# Patient Record
Sex: Female | Born: 1935 | Race: White | Hispanic: No | Marital: Married
Health system: Southern US, Community
[De-identification: ages and names within clinical notes are randomized; demographics above are authoritative.]

## PROBLEM LIST (undated history)

## (undated) DIAGNOSIS — L570 Actinic keratosis: Secondary | ICD-10-CM

## (undated) HISTORY — DX: Actinic keratosis: L57.0

---

## 2004-11-08 ENCOUNTER — Ambulatory Visit: Payer: Self-pay | Admitting: Internal Medicine

## 2004-11-21 ENCOUNTER — Ambulatory Visit: Payer: Self-pay | Admitting: Unknown Physician Specialty

## 2005-11-13 ENCOUNTER — Emergency Department: Payer: Self-pay | Admitting: Emergency Medicine

## 2005-11-21 ENCOUNTER — Emergency Department: Payer: Self-pay | Admitting: Emergency Medicine

## 2005-11-22 ENCOUNTER — Ambulatory Visit: Payer: Self-pay | Admitting: Internal Medicine

## 2006-10-13 ENCOUNTER — Other Ambulatory Visit: Payer: Self-pay

## 2006-10-13 ENCOUNTER — Emergency Department: Payer: Self-pay | Admitting: Emergency Medicine

## 2006-11-27 ENCOUNTER — Ambulatory Visit: Payer: Self-pay | Admitting: Internal Medicine

## 2007-04-18 ENCOUNTER — Ambulatory Visit: Payer: Self-pay | Admitting: Family Medicine

## 2007-09-23 DIAGNOSIS — Z85828 Personal history of other malignant neoplasm of skin: Secondary | ICD-10-CM

## 2007-09-23 HISTORY — DX: Personal history of other malignant neoplasm of skin: Z85.828

## 2008-02-12 ENCOUNTER — Ambulatory Visit: Payer: Self-pay | Admitting: Internal Medicine

## 2008-07-14 ENCOUNTER — Ambulatory Visit: Payer: Self-pay | Admitting: Internal Medicine

## 2008-09-11 DIAGNOSIS — C4491 Basal cell carcinoma of skin, unspecified: Secondary | ICD-10-CM

## 2008-09-11 HISTORY — DX: Basal cell carcinoma of skin, unspecified: C44.91

## 2009-04-04 ENCOUNTER — Ambulatory Visit: Payer: Self-pay | Admitting: Family Medicine

## 2011-07-31 ENCOUNTER — Ambulatory Visit: Payer: Self-pay | Admitting: Family Medicine

## 2011-07-31 LAB — BASIC METABOLIC PANEL
Calcium, Total: 9.6 mg/dL (ref 8.5–10.1)
Creatinine: 0.77 mg/dL (ref 0.60–1.30)
EGFR (African American): >60
EGFR (Non-African Amer.): >60
Glucose: 87 mg/dL (ref 65–99)
Osmolality: 284 (ref 275–301)
Potassium: 3.8 mmol/L (ref 3.5–5.1)
Sodium: 142 mmol/L (ref 136–145)

## 2015-03-25 ENCOUNTER — Other Ambulatory Visit: Payer: Self-pay | Admitting: Family Medicine

## 2015-03-25 ENCOUNTER — Ambulatory Visit
Admission: RE | Admit: 2015-03-25 | Discharge: 2015-03-25 | Disposition: A | Discharge status: Home / Self Care | Payer: Medicare Other | Source: Ambulatory Visit | Attending: Family Medicine | Admitting: Family Medicine

## 2015-03-25 DIAGNOSIS — M79672 Pain in left foot: Secondary | ICD-10-CM | POA: Diagnosis present

## 2016-04-03 ENCOUNTER — Other Ambulatory Visit: Payer: Self-pay | Admitting: Family Medicine

## 2016-04-03 DIAGNOSIS — M8588 Other specified disorders of bone density and structure, other site: Secondary | ICD-10-CM

## 2016-04-03 DIAGNOSIS — M8580 Other specified disorders of bone density and structure, unspecified site: Secondary | ICD-10-CM

## 2016-08-30 ENCOUNTER — Other Ambulatory Visit: Payer: Self-pay | Admitting: Family Medicine

## 2016-08-30 DIAGNOSIS — M8000XA Age-related osteoporosis with current pathological fracture, unspecified site, initial encounter for fracture: Secondary | ICD-10-CM

## 2017-03-17 IMAGING — CR DG FOOT COMPLETE 3+V*L*
3 series · 3 of 3 positions shown · non-contrast
Comparison: None.

CLINICAL DATA: Lateral foot pain. The patient has fallen twice in
the last month.

EXAM:
LEFT FOOT - COMPLETE 3+ VIEW

[foot ap]
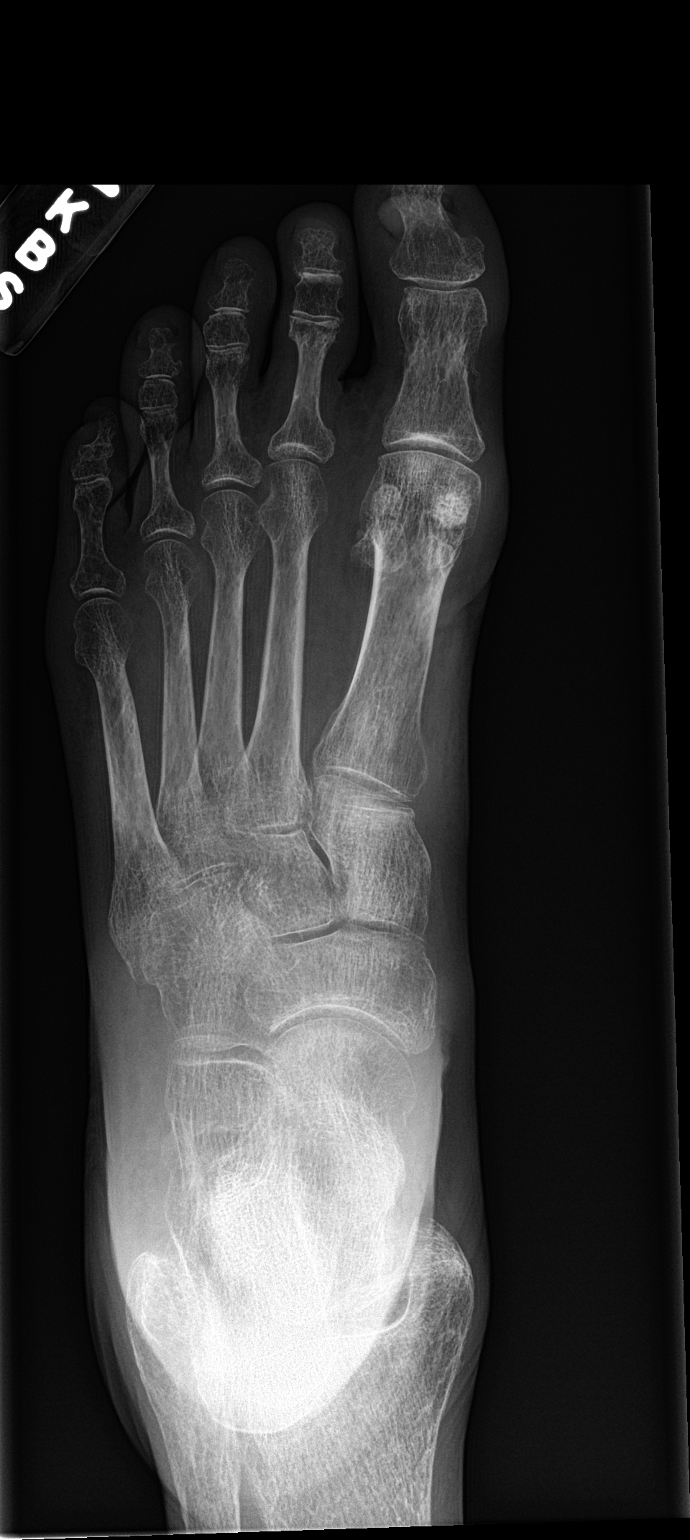

[foot obl]
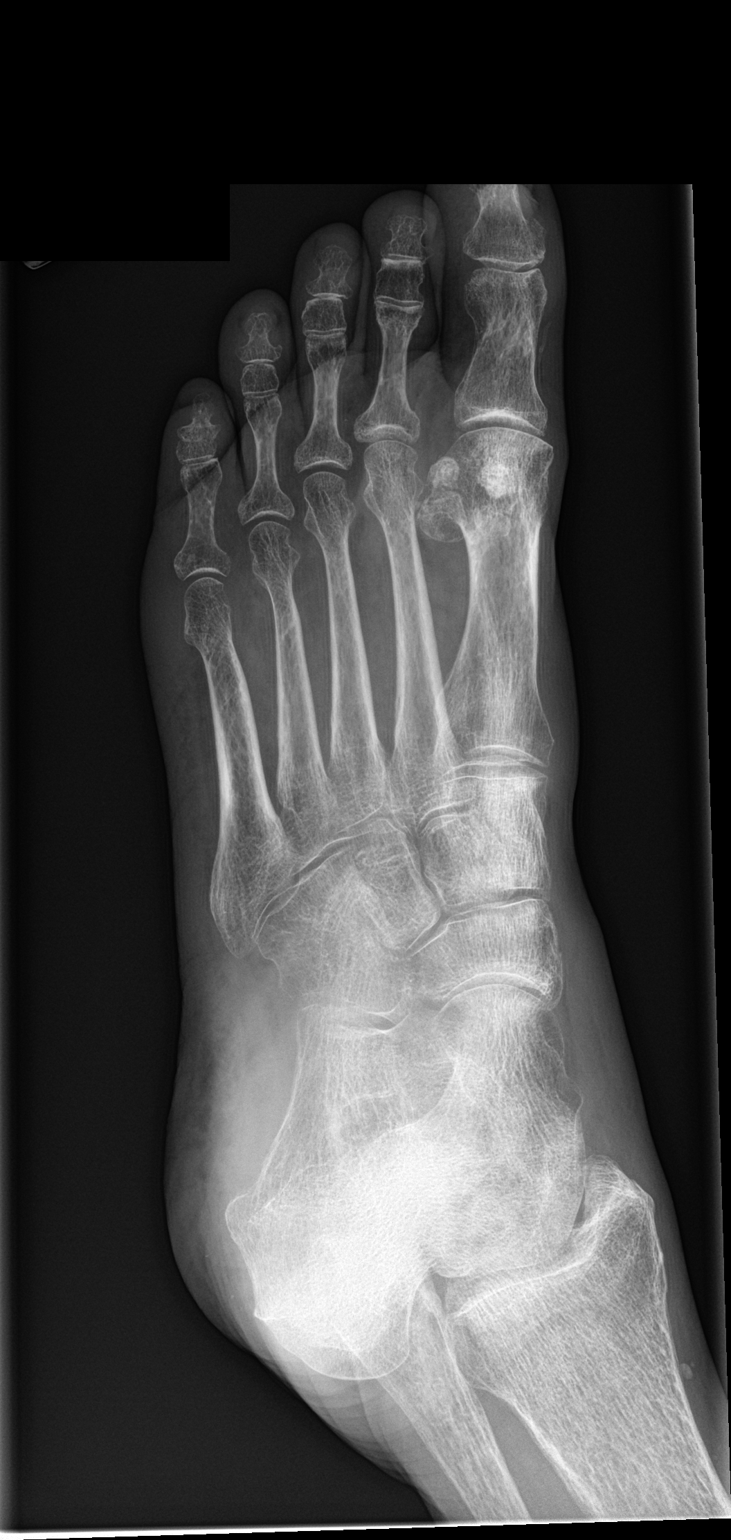

[foot lat]
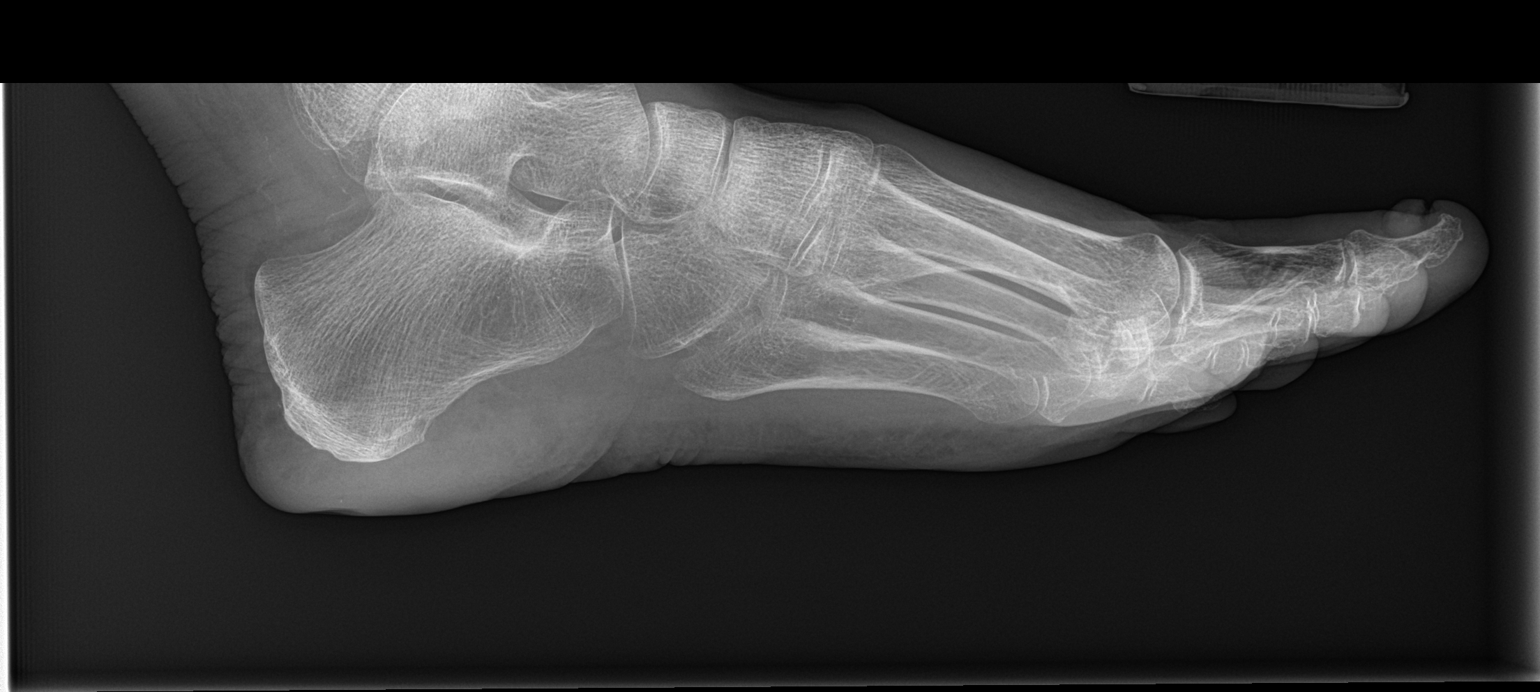

[3 of 3 positions shown; findings below may reference images not displayed]

FINDINGS: There is no fracture or dislocation. There is diffuse osteopenia.
Slight degenerative changes at the first metatarsal phalangeal
joint.
IMPRESSION: No acute abnormality.

## 2019-09-01 DIAGNOSIS — C4491 Basal cell carcinoma of skin, unspecified: Secondary | ICD-10-CM

## 2019-09-01 HISTORY — DX: Basal cell carcinoma of skin, unspecified: C44.91

## 2019-10-08 ENCOUNTER — Other Ambulatory Visit: Payer: Self-pay

## 2019-10-08 ENCOUNTER — Ambulatory Visit (INDEPENDENT_AMBULATORY_CARE_PROVIDER_SITE_OTHER): Payer: Medicare Other | Admitting: Dermatology

## 2019-10-08 DIAGNOSIS — L817 Pigmented purpuric dermatosis: Secondary | ICD-10-CM | POA: Diagnosis not present

## 2019-10-08 DIAGNOSIS — L578 Other skin changes due to chronic exposure to nonionizing radiation: Secondary | ICD-10-CM | POA: Diagnosis not present

## 2019-10-08 DIAGNOSIS — Z1283 Encounter for screening for malignant neoplasm of skin: Secondary | ICD-10-CM

## 2019-10-08 DIAGNOSIS — Z85828 Personal history of other malignant neoplasm of skin: Secondary | ICD-10-CM

## 2019-10-08 DIAGNOSIS — L821 Other seborrheic keratosis: Secondary | ICD-10-CM

## 2019-10-08 DIAGNOSIS — L820 Inflamed seborrheic keratosis: Secondary | ICD-10-CM | POA: Diagnosis not present

## 2019-10-08 DIAGNOSIS — D229 Melanocytic nevi, unspecified: Secondary | ICD-10-CM

## 2019-10-08 DIAGNOSIS — L814 Other melanin hyperpigmentation: Secondary | ICD-10-CM

## 2019-10-08 NOTE — Progress Notes (Signed)
   Follow-Up Visit   Subjective  Cassandra Glover is a 86 y.o. female who presents for the following: Annual Exam (1 year f/u TBSE, Hx of BCC L sup helix 09-01-2019, no symptoms, ) and lesions (crusty irritated growths on my chest and L thigh for several years now,).  A total body skin exam for skin cancer screening was performed today.  The following portions of the chart were reviewed this encounter and updated as appropriate: Allergies  Meds  Problems  Med Hx  Surg Hx  Fam Hx      Review of Systems: No other skin or systemic complaints.  Objective  Well appearing patient in no apparent distress; mood and affect are within normal limits.  A full examination was performed including scalp, head, eyes, ears, nose, lips, neck, chest, axillae, abdomen, back, buttocks, bilateral upper extremities, bilateral lower extremities, hands, feet, fingers, toes, fingernails, and toenails. All findings within normal limits unless otherwise noted below.  Objective  Left superior helix: Well healed scar with no evidence of recurrence.   Objective  Chest, L thigh (2): Erythematous keratotic or waxy stuck-on papule or plaque.   Objective  Left Foot - Anterior: Violaceous macules and patches.   Assessment & Plan  History of basal cell carcinoma (BCC) Left superior helix Clear today  Inflamed seborrheic keratosis (2) Chest, L thigh  Destruction of lesion - Chest, L thigh Complexity: simple   Destruction method: cryotherapy   Informed consent: discussed and consent obtained   Timeout:  patient name, date of birth, surgical site, and procedure verified Lesion destroyed using liquid nitrogen: Yes   Region frozen until ice ball extended beyond lesion: Yes   Outcome: patient tolerated procedure well with no complications   Post-procedure details: wound care instructions given    Schamberg's purpura Left Foot - Anterior  Schamberg's purpura with stasis dermatitis   Skin cancer  screening performed today. Actinic Damage - diffuse scaly erythematous macules with underlying dyspigmentation - Recommend daily broad spectrum sunscreen SPF 30+ to sun-exposed areas, reapply every 2 hours as needed.  - Call for new or changing lesions. Seborrheic Keratoses - Stuck-on, waxy, tan-brown papules and plaques  - Discussed benign etiology and prognosis. - Observe - Call for any changes Varicose Veins - Dilated blue, purple or red veins at the lower extremities - Reassured - These can be treated by sclerotherapy (a procedure to inject a medicine into the veins to make them disappear) if desired, but the treatment is not covered by insurance History of Squamous Cell Carcinoma of the Skin - No evidence of recurrence today - No lymphadenopathy - Recommend regular full body skin exams - Recommend daily broad spectrum sunscreen SPF 30+ to sun-exposed areas, reapply every 2 hours as needed.  - Call if any new or changing lesions are noted between office visits Melanocytic Nevi - Tan-brown and/or pink-flesh-colored symmetric macules and papules - Benign appearing on exam today - Observation - Call clinic for new or changing moles - Recommend daily use of broad spectrum spf 30+ sunscreen to sun-exposed areas.  Lentigines - Scattered tan macules - Discussed due to sun exposure - Benign, observe - Call for any changes  Return in about 1 year (around 10/07/2020) for TBSE.  IMarye Round, CMA, am acting as scribe for Sarina Ser, MD . Documentation: I have reviewed the above documentation for accuracy and completeness, and I agree with the above.  Sarina Ser, MD

## 2019-10-08 NOTE — Patient Instructions (Signed)
Prior to procedure, discussed risks of blister formation, small wound, skin dyspigmentation, or rare scar following cryotherapy.

## 2019-10-12 ENCOUNTER — Encounter: Payer: Self-pay | Admitting: Dermatology

## 2020-02-18 ENCOUNTER — Ambulatory Visit: Payer: Medicare Other | Admitting: Dermatology

## 2020-03-15 ENCOUNTER — Ambulatory Visit (INDEPENDENT_AMBULATORY_CARE_PROVIDER_SITE_OTHER): Payer: Medicare Other | Admitting: Dermatology

## 2020-03-15 ENCOUNTER — Other Ambulatory Visit: Payer: Self-pay

## 2020-03-15 DIAGNOSIS — L578 Other skin changes due to chronic exposure to nonionizing radiation: Secondary | ICD-10-CM

## 2020-03-15 DIAGNOSIS — L570 Actinic keratosis: Secondary | ICD-10-CM

## 2020-03-15 DIAGNOSIS — R600 Localized edema: Secondary | ICD-10-CM

## 2020-03-15 DIAGNOSIS — Z85828 Personal history of other malignant neoplasm of skin: Secondary | ICD-10-CM | POA: Diagnosis not present

## 2020-03-15 DIAGNOSIS — L719 Rosacea, unspecified: Secondary | ICD-10-CM

## 2020-03-15 NOTE — Progress Notes (Signed)
   Follow-Up Visit   Subjective  Cassandra Glover is a 86 y.o. female who presents for the following: Follow-up (History of BCC of left sup helix - excised 08/04/2019, re-excised + margin 09/01/2019 (margins free)) and Other (Recheck rosacea papule vs other of left nose - Aczone samples given 08/04/2019).  The following portions of the chart were reviewed this encounter and updated as appropriate:  Allergies  Meds  Problems  Med Hx  Surg Hx  Fam Hx     Review of Systems:  No other skin or systemic complaints except as noted in HPI or Assessment and Plan.  Objective  Well appearing patient in no apparent distress; mood and affect are within normal limits.  A focused examination was performed including face, ears. Relevant physical exam findings are noted in the Assessment and Plan.  Objective  Left Superior Helix: Well healed scar with no evidence of recurrence.   Objective  Left Nasal Sidewall: Erythematous thin papules/macules with gritty scale.   Objective  Left nose: Pink papule   Objective  Right Lower Eyelid : Mild edema   Assessment & Plan    Actinic Damage - diffuse scaly erythematous macules with underlying dyspigmentation - Recommend daily broad spectrum sunscreen SPF 30+ to sun-exposed areas, reapply every 2 hours as needed.  - Call for new or changing lesions.   History of basal cell carcinoma (BCC) Left Superior Helix Clear. Observe for recurrence. Call clinic for new or changing lesions.  Recommend regular skin exams, daily broad-spectrum spf 30+ sunscreen use, and photoprotection.     AK (actinic keratosis) Left Nasal Sidewall  Destruction of lesion - Left Nasal Sidewall Complexity: simple   Destruction method: cryotherapy   Informed consent: discussed and consent obtained   Timeout:  patient name, date of birth, surgical site, and procedure verified Lesion destroyed using liquid nitrogen: Yes   Region frozen until ice ball extended beyond  lesion: Yes   Outcome: patient tolerated procedure well with no complications   Post-procedure details: wound care instructions given    Rosacea Left nose Alternate Aczone gel and Altreno qhs - samples given.  Localized edema Right Lower Eyelid  Mild. No treatment  Return in about 4 months (around 07/15/2020).  I, Ashok Cordia, CMA, am acting as scribe for Sarina Ser, MD .  Documentation: I have reviewed the above documentation for accuracy and completeness, and I agree with the above.  Sarina Ser, MD

## 2020-03-15 NOTE — Patient Instructions (Signed)

## 2020-03-16 ENCOUNTER — Encounter: Payer: Self-pay | Admitting: Dermatology

## 2020-06-30 ENCOUNTER — Other Ambulatory Visit: Payer: Self-pay

## 2020-06-30 ENCOUNTER — Ambulatory Visit (INDEPENDENT_AMBULATORY_CARE_PROVIDER_SITE_OTHER): Payer: Medicare Other | Admitting: Dermatology

## 2020-06-30 DIAGNOSIS — L578 Other skin changes due to chronic exposure to nonionizing radiation: Secondary | ICD-10-CM | POA: Diagnosis not present

## 2020-06-30 DIAGNOSIS — Z872 Personal history of diseases of the skin and subcutaneous tissue: Secondary | ICD-10-CM | POA: Diagnosis not present

## 2020-06-30 DIAGNOSIS — L719 Rosacea, unspecified: Secondary | ICD-10-CM | POA: Diagnosis not present

## 2020-06-30 DIAGNOSIS — Z85828 Personal history of other malignant neoplasm of skin: Secondary | ICD-10-CM | POA: Diagnosis not present

## 2020-06-30 NOTE — Progress Notes (Signed)
   Follow-Up Visit   Subjective  Cassandra Glover is a 87 y.o. female who presents for the following: Actinic Keratosis (L nasal sidewal 63m f/u).  The following portions of the chart were reviewed this encounter and updated as appropriate:   Allergies  Meds  Problems  Med Hx  Surg Hx  Fam Hx     Review of Systems:  No other skin or systemic complaints except as noted in HPI or Assessment and Plan.  Objective  Well appearing patient in no apparent distress; mood and affect are within normal limits.  A focused examination was performed including face. Relevant physical exam findings are noted in the Assessment and Plan.  Objective  Left nasal sidewall: Nose clear  Objective  nose: Nose clear today   Assessment & Plan   Actinic Damage - chronic, secondary to cumulative UV radiation exposure/sun exposure over time - diffuse scaly erythematous macules with underlying dyspigmentation - Recommend daily broad spectrum sunscreen SPF 30+ to sun-exposed areas, reapply every 2 hours as needed.  - Call for new or changing lesions.  History of actinic keratoses Left nasal sidewall  Clear, observe  Rosacea nose  Rosacea is a chronic progressive skin condition usually affecting the face of adults, causing redness and/or acne bumps. It is treatable but not curable. It sometimes affects the eyes (ocular rosacea) as well. It may respond to topical and/or systemic medication and can flare with stress, sun exposure, alcohol, exercise and some foods.  Daily application of broad spectrum spf 30+ sunscreen to face is recommended to reduce flares.  Cont Altreno qohs, pt has samples   History of Basal Cell Carcinoma of the Skin - No evidence of recurrence today - Recommend regular full body skin exams - Recommend daily broad spectrum sunscreen SPF 30+ to sun-exposed areas, reapply every 2 hours as needed.  - Call if any new or changing lesions are noted between office visits - L sup  helix, clear  Return for as scheduled 10/13/20 for TBSE, hx of BCC, AKs.   I, Sonya Hupman, RMA, am acting as scribe for Armida Sans, MD .  Documentation: I have reviewed the above documentation for accuracy and completeness, and I agree with the above.  Armida Sans, MD

## 2020-07-01 ENCOUNTER — Encounter: Payer: Self-pay | Admitting: Dermatology

## 2020-10-13 ENCOUNTER — Ambulatory Visit (INDEPENDENT_AMBULATORY_CARE_PROVIDER_SITE_OTHER): Payer: Medicare Other | Admitting: Dermatology

## 2020-10-13 ENCOUNTER — Encounter: Payer: Self-pay | Admitting: Dermatology

## 2020-10-13 ENCOUNTER — Other Ambulatory Visit: Payer: Self-pay

## 2020-10-13 DIAGNOSIS — D1800 Hemangioma unspecified site: Secondary | ICD-10-CM

## 2020-10-13 DIAGNOSIS — Z1283 Encounter for screening for malignant neoplasm of skin: Secondary | ICD-10-CM

## 2020-10-13 DIAGNOSIS — L821 Other seborrheic keratosis: Secondary | ICD-10-CM

## 2020-10-13 DIAGNOSIS — D692 Other nonthrombocytopenic purpura: Secondary | ICD-10-CM

## 2020-10-13 DIAGNOSIS — I872 Venous insufficiency (chronic) (peripheral): Secondary | ICD-10-CM

## 2020-10-13 DIAGNOSIS — D229 Melanocytic nevi, unspecified: Secondary | ICD-10-CM

## 2020-10-13 DIAGNOSIS — Z85828 Personal history of other malignant neoplasm of skin: Secondary | ICD-10-CM | POA: Diagnosis not present

## 2020-10-13 DIAGNOSIS — L578 Other skin changes due to chronic exposure to nonionizing radiation: Secondary | ICD-10-CM

## 2020-10-13 DIAGNOSIS — Z872 Personal history of diseases of the skin and subcutaneous tissue: Secondary | ICD-10-CM

## 2020-10-13 DIAGNOSIS — L814 Other melanin hyperpigmentation: Secondary | ICD-10-CM

## 2020-10-13 NOTE — Progress Notes (Signed)
Follow-Up Visit   Subjective  Cassandra Glover is a 87 y.o. female who presents for the following: Total body skin exam (Hx of BCCs, AKs). The patient presents for Total-Body Skin Exam (TBSE) for skin cancer screening and mole check.  The following portions of the chart were reviewed this encounter and updated as appropriate:   Allergies  Meds  Problems  Med Hx  Surg Hx  Fam Hx     Review of Systems:  No other skin or systemic complaints except as noted in HPI or Assessment and Plan.  Objective  Well appearing patient in no apparent distress; mood and affect are within normal limits.  A full examination was performed including scalp, head, eyes, ears, nose, lips, neck, chest, axillae, abdomen, back, buttocks, bilateral upper extremities, bilateral lower extremities, hands, feet, fingers, toes, fingernails, and toenails. All findings within normal limits unless otherwise noted below.  Objective  multiple: Well healed scars with no evidence of recurrence.   Objective  bil lower legs: Stasis changes bil lower legs   Assessment & Plan    Lentigines - Scattered tan macules - Due to sun exposure - Benign-appering, observe - Recommend daily broad spectrum sunscreen SPF 30+ to sun-exposed areas, reapply every 2 hours as needed. - Call for any changes  Seborrheic Keratoses - Stuck-on, waxy, tan-brown papules and/or plaques  - Benign-appearing - Discussed benign etiology and prognosis. - Observe - Call for any changes  Melanocytic Nevi - Tan-brown and/or pink-flesh-colored symmetric macules and papules - Benign appearing on exam today - Observation - Call clinic for new or changing moles - Recommend daily use of broad spectrum spf 30+ sunscreen to sun-exposed areas.   Hemangiomas - Red papules - Discussed benign nature - Observe - Call for any changes  Actinic Damage - Chronic condition, secondary to cumulative UV/sun exposure - diffuse scaly erythematous  macules with underlying dyspigmentation - Recommend daily broad spectrum sunscreen SPF 30+ to sun-exposed areas, reapply every 2 hours as needed.  - Staying in the shade or wearing long sleeves, sun glasses (UVA+UVB protection) and wide brim hats (4-inch brim around the entire circumference of the hat) are also recommended for sun protection.  - Call for new or changing lesions.  Skin cancer screening performed today.  Purpura - Chronic; persistent and recurrent.  Treatable, but not curable. - Violaceous macules and patches - Benign - Related to trauma, age, sun damage and/or use of blood thinners, chronic use of topical and/or oral steroids - Observe - Can use OTC arnica containing moisturizer such as Dermend Bruise Formula if desired - Call for worsening or other concerns   History of PreCancerous Actinic Keratosis  - site(s) of PreCancerous Actinic Keratosis clear today. - these may recur and new lesions may form requiring treatment to prevent transformation into skin cancer - observe for new or changing spots and contact [facility] for appointment if occur - photoprotection with sun protective clothing; sunglasses and broad spectrum sunscreen with SPF of at least 30 + and frequent self skin exams recommended - yearly exams by a dermatologist recommended for persons with history of PreCancerous Actinic Keratoses  History of basal cell carcinoma (BCC) multiple  Clear. Observe for recurrence. Call clinic for new or changing lesions.  Recommend regular skin exams, daily broad-spectrum spf 30+ sunscreen use, and photoprotection.     Stasis dermatitis of both legs bil lower legs  Chronic, persistent Stasis in the legs causes chronic leg swelling, which may result in itchy or  painful rashes, skin discoloration, skin texture changes, and sometimes ulceration.  Recommend daily compression hose/stockings- easiest to put on first thing in morning, remove at bedtime.  Elevate legs as  much as possible. Avoid salt/sodium rich foods.   Benign, Observe   Skin cancer screening  Return in about 1 year (around 10/13/2021) for TBSE, Hx of BCC, Hx of AKs.  I, Othelia Pulling, RMA, am acting as scribe for Sarina Ser, MD .  Documentation: I have reviewed the above documentation for accuracy and completeness, and I agree with the above.  Sarina Ser, MD

## 2020-10-13 NOTE — Patient Instructions (Signed)

## 2020-10-17 ENCOUNTER — Encounter: Payer: Self-pay | Admitting: Dermatology

## 2021-09-13 ENCOUNTER — Encounter: Payer: Self-pay | Admitting: Dermatology

## 2021-09-14 ENCOUNTER — Ambulatory Visit: Payer: Medicare Other | Admitting: Dermatology

## 2021-10-19 ENCOUNTER — Encounter: Payer: Medicare Other | Admitting: Dermatology

## 2022-04-19 ENCOUNTER — Ambulatory Visit (INDEPENDENT_AMBULATORY_CARE_PROVIDER_SITE_OTHER): Payer: Medicare Other | Admitting: Dermatology

## 2022-04-19 DIAGNOSIS — L820 Inflamed seborrheic keratosis: Secondary | ICD-10-CM

## 2022-04-19 DIAGNOSIS — L578 Other skin changes due to chronic exposure to nonionizing radiation: Secondary | ICD-10-CM

## 2022-04-19 DIAGNOSIS — L821 Other seborrheic keratosis: Secondary | ICD-10-CM

## 2022-04-19 DIAGNOSIS — D485 Neoplasm of uncertain behavior of skin: Secondary | ICD-10-CM

## 2022-04-19 DIAGNOSIS — D492 Neoplasm of unspecified behavior of bone, soft tissue, and skin: Secondary | ICD-10-CM

## 2022-04-19 MED ORDER — MUPIROCIN 2 % EX OINT: TOPICAL_OINTMENT | CUTANEOUS | 1 refills | Status: DC

## 2022-04-19 NOTE — Progress Notes (Unsigned)
Follow-Up Visit   Subjective  Cassandra Glover is a 88 y.o. female who presents for the following: Skin Problem. The patient has spots, moles and lesions to be evaluated, some may be new or changing and the patient has concerns that these could be cancer.   The following portions of the chart were reviewed this encounter and updated as appropriate:   Allergies  Meds  Problems  Med Hx  Surg Hx  Fam Hx     Review of Systems:  No other skin or systemic complaints except as noted in HPI or Assessment and Plan.  Objective  Well appearing patient in no apparent distress; mood and affect are within normal limits.  A focused examination was performed including neck,chest,hands. Relevant physical exam findings are noted in the Assessment and Plan.  left neck x 3, left wrist x 1  (4) (4) Stuck-on, waxy, tan-brown papules -- Discussed benign etiology and prognosis.   right 5th finger MCP 1.5 cm hyperkeratotic papule         Assessment & Plan  Inflamed seborrheic keratosis (4) left neck x 3, left wrist x 1  (4)  Symptomatic, irritating, patient would like treated.   Destruction of lesion - left neck x 3, left wrist x 1  (4) Complexity: simple   Destruction method: cryotherapy   Informed consent: discussed and consent obtained   Timeout:  patient name, date of birth, surgical site, and procedure verified Lesion destroyed using liquid nitrogen: Yes   Region frozen until ice ball extended beyond lesion: Yes   Outcome: patient tolerated procedure well with no complications   Post-procedure details: wound care instructions given    Neoplasm of skin right 5th finger MCP  Skin excision  Lesion length (cm):  1.5 Lesion width (cm):  1.5 Margin per side (cm):  0.2 Total excision diameter (cm):  1.9 Informed consent: discussed and consent obtained   Timeout: patient name, date of birth, surgical site, and procedure verified   Procedure prep:  Patient was prepped and draped  in usual sterile fashion Prep type:  Isopropyl alcohol and povidone-iodine Anesthesia: the lesion was anesthetized in a standard fashion   Anesthetic:  1% lidocaine w/ epinephrine 1-100,000 buffered w/ 8.4% NaHCO3 Instrument used: #15 blade   Hemostasis achieved with: pressure   Hemostasis achieved with comment:  Electrocautery Outcome: patient tolerated procedure well with no complications   Post-procedure details: sterile dressing applied and wound care instructions given   Dressing type: bandage and pressure dressing (Mupirocin)    Destruction of lesion  Destruction method: electrodesiccation and curettage   Informed consent: discussed and consent obtained   Timeout:  patient name, date of birth, surgical site, and procedure verified Anesthesia: the lesion was anesthetized in a standard fashion   Anesthetic:  1% lidocaine w/ epinephrine 1-100,000 buffered w/ 8.4% NaHCO3 Curettage performed in three different directions: Yes   Electrodesiccation performed over the curetted area: Yes   Curettage cycles:  3 Final wound size (cm):  1.9 Hemostasis achieved with:  electrodesiccation Outcome: patient tolerated procedure well with no complications   Post-procedure details: sterile dressing applied and wound care instructions given   Dressing type: petrolatum    Specimen 1 - Surgical pathology Differential Diagnosis: R/O SCC  Check Margins: No  Related Medications mupirocin ointment (BACTROBAN) 2 % Apply to skin qd-bid  Actinic Damage - chronic, secondary to cumulative UV radiation exposure/sun exposure over time - diffuse scaly erythematous macules with underlying dyspigmentation - Recommend daily broad spectrum sunscreen  SPF 30+ to sun-exposed areas, reapply every 2 hours as needed.  - Recommend staying in the shade or wearing long sleeves, sun glasses (UVA+UVB protection) and wide brim hats (4-inch brim around the entire circumference of the hat). - Call for new or changing  lesions.  Seborrheic Keratoses - Stuck-on, waxy, tan-brown papules and/or plaques  - Benign-appearing - Discussed benign etiology and prognosis. - Observe - Call for any changes  Return in about 1 year (around 04/20/2023) for hx of skin cancer .  IMarye Round, CMA, am acting as scribe for Sarina Ser, MD .  Documentation: I have reviewed the above documentation for accuracy and completeness, and I agree with the above.  Sarina Ser, MD

## 2022-04-19 NOTE — Patient Instructions (Addendum)
Wound Care Instructions  Cleanse wound gently with soap and water once a day then pat dry with clean gauze. Apply a thin coat of Mupirocin  over the wound (unless you have an allergy to this).  Do not pick or remove scabs. Do not remove the yellow or white "healing tissue" from the base of the wound.  Cover the wound with fresh, clean, nonstick gauze and secure with paper tape. You may use Band-Aids in place of gauze and tape if the wound is small enough, but would recommend trimming much of the tape off as there is often too much. Sometimes Band-Aids can irritate the skin.  You should call the office for your biopsy report after 1 week if you have not already been contacted.  If you experience any problems, such as abnormal amounts of bleeding, swelling, significant bruising, significant pain, or evidence of infection, please call the office immediately.  FOR ADULT SURGERY PATIENTS: If you need something for pain relief you may take 1 extra strength Tylenol (acetaminophen) AND 2 Ibuprofen ('200mg'$  each) together every 4 hours as needed for pain. (do not take these if you are allergic to them or if you have a reason you should not take them.) Typically, you may only need pain medication for 1 to 3 days.         Cryotherapy Aftercare  Wash gently with soap and water everyday.   Apply Vaseline and Band-Aid daily until healed.      Due to recent changes in healthcare laws, you may see results of your pathology and/or laboratory studies on MyChart before the doctors have had a chance to review them. We understand that in some cases there may be results that are confusing or concerning to you. Please understand that not all results are received at the same time and often the doctors may need to interpret multiple results in order to provide you with the best plan of care or course of treatment. Therefore, we ask that you please give [place] 2 business days to thoroughly review all your results before  contacting the office for clarification. Should we see a critical lab result, you will be contacted sooner.   If You Need Anything After Your Visit  If you have any questions or concerns for your doctor, please call our main line at [phone] and press option 4 to reach your doctor's medical assistant. If no one answers, please leave a voicemail as directed and we will return your call as soon as possible. Messages left after 4 pm will be answered the following business day.   You may also send [place] a message via [facility]. We typically respond to MyChart messages within 1-2 business days.  For prescription refills, please ask your pharmacy to contact our office. Our fax number is [phone].  If you have an urgent issue when the clinic is closed that cannot wait until the next business day, you can page your doctor at the number below.    Please note that while we do our best to be available for urgent issues outside of office hours, we are not available 24/7.   If you have an urgent issue and are unable to reach [place], you may choose to seek medical care at your doctor's office, retail clinic, urgent care center, or emergency room.  If you have a medical emergency, please immediately call 911 or go to the emergency department.  Pager Numbers  - Dr. Nehemiah Massed: [phone]  - Dr. Laurence Ferrari: [phone]  -  Dr. Nicole Kindred: [phone]  In the event of inclement weather, please call our main line at [phone] for an update on the status of any delays or closures.  Dermatology Medication Tips: Please keep the boxes that topical medications come in in order to help keep track of the instructions about where and how to use these. Pharmacies typically print the medication instructions only on the boxes and not directly on the medication tubes.   If your medication is too expensive, please contact our office at [phone] option 4 or send [place] a message through [place].   We are unable to tell  what your co-pay for medications will be in advance as this is different depending on your insurance coverage. However, we may be able to find a substitute medication at lower cost or fill out paperwork to get insurance to cover a needed medication.   If a prior authorization is required to get your medication covered by your insurance company, please allow [place] 1-2 business days to complete this process.  Drug prices often vary depending on where the prescription is filled and some pharmacies may offer cheaper prices.  The website www.goodrx.com contains coupons for medications through different pharmacies. The prices here do not account for what the cost may be with help from insurance (it may be cheaper with your insurance), but the website can give you the price if you did not use any insurance.  - You can print the associated coupon and take it with your prescription to the pharmacy.  - You may also stop by our office during regular business hours and pick up a GoodRx coupon card.  - If you need your prescription sent electronically to a different pharmacy, notify our office through [facility] or by phone at [phone] option 4.     Si Usted Necesita Algo Despus de Su Visita  Tambin puede enviarnos un mensaje a travs de Pharmacist, community. Por lo general respondemos a los mensajes de MyChart en el transcurso de 1 a 2 das hbiles.  Para renovar recetas, por favor pida a su farmacia que se ponga en contacto con nuestra oficina. Harland Dingwall de fax es [place] [phone].  Si tiene un asunto urgente cuando la clnica est cerrada y que no puede esperar hasta el siguiente da hbil, puede llamar/localizar a su doctor(a) al nmero que aparece a continuacin.   Por favor, tenga en cuenta que aunque hacemos todo lo posible para estar disponibles para asuntos urgentes fuera del horario de [place], no estamos disponibles las 24 horas del da, los 7 das de la [place].   Si tiene un problema  urgente y no puede comunicarse con nosotros, puede optar por buscar atencin mdica  en el consultorio de su doctor(a), en una clnica privada, en un centro de atencin urgente o en una sala de emergencias.  Si tiene Engineering geologist, por favor llame inmediatamente al 911 o vaya a la sala de emergencias.  Nmeros de bper  - Dr. Nehemiah Massed: [phone]  - Dra. Moye: [phone]  - Dra. Nicole Kindred: [phone]  En caso de inclemencias del [place], por favor llame a Johnsie Kindred principal al [phone] para una actualizacin sobre el [place] de cualquier retraso o cierre.  Consejos para la medicacin en dermatologa: Por favor, guarde las cajas en las que vienen los medicamentos de uso tpico para ayudarle a seguir las instrucciones sobre dnde y cmo usarlos. Las farmacias generalmente imprimen las instrucciones del medicamento slo en las cajas y no directamente en los tubos  del medicamento.   Si su medicamento es muy caro, por favor, pngase en contacto con Zigmund Daniel llamando al [phone] y presione la opcin 4 o envenos un mensaje a travs de Pharmacist, community.   No podemos decirle cul ser su copago por los medicamentos por adelantado ya que esto es diferente dependiendo de la cobertura de su seguro. Sin embargo, es posible que podamos encontrar un medicamento sustituto a Electrical engineer un formulario para que el seguro cubra el medicamento que se considera necesario.   Si se requiere una autorizacin previa para que su compaa de seguros [place] su medicamento, por favor permtanos de 1 a 2 das hbiles para completar este proceso.  Los precios de los medicamentos varan con frecuencia dependiendo del Environmental consultant de dnde se surte la receta y alguna farmacias pueden ofrecer precios ms baratos.  El sitio web www.goodrx.com tiene cupones para medicamentos de Airline pilot. Los precios aqu no tienen en cuenta lo que podra costar con la ayuda del seguro (puede ser ms barato con  su seguro), pero el sitio web puede darle el precio si no utiliz Research scientist (physical sciences).  - Puede imprimir el cupn correspondiente y llevarlo con su receta a la farmacia.  - Tambin puede pasar por nuestra oficina durante el horario de atencin regular y Charity fundraiser una tarjeta de cupones de GoodRx.  - Si necesita que su receta se enve electrnicamente a una farmacia diferente, informe a nuestra oficina a travs de MyChart de [place] o por telfono llamando al [phone] y presione la opcin 4.

## 2022-04-24 ENCOUNTER — Encounter: Payer: Self-pay | Admitting: Dermatology

## 2022-04-26 ENCOUNTER — Telehealth: Payer: Self-pay

## 2022-04-26 NOTE — Telephone Encounter (Signed)
-----   Message from Ralene Bathe, MD sent at 04/25/2022  6:09 PM EDT ----- Diagnosis Skin , right 5th finger MCP BENIGN EPIDERMAL HYPERPLASIA WITH UNDERLYING FIBROHISTIOCYTIC CHANGES, SEE DESCRIPTION  Benign growth No further treatment needed Recheck next visit

## 2022-04-26 NOTE — Telephone Encounter (Signed)
Advised patient of pathology results/hd 

## 2023-04-25 ENCOUNTER — Ambulatory Visit: Payer: Medicare Other | Admitting: Dermatology

## 2024-04-17 NOTE — ED Provider Notes (Signed)
 " [facility] EMERGENCY DEPT  ED Provider Note History   Chief Complaint  Patient presents with   Hemorrhoids   Cyst   History of Present Illness This is a 89 year old female with past medical history of hypothyroidism, A-fib, hyperlipidemia who is presenting to the ED for evaluation of reducible rectal mass that she noticed 2 days ago.  She reports has never had symptoms like this before and prior to coming she pressed the mass back into her rectum.  She reports pain when the mass is protruding, currently pain-free.  She denies any fevers, chills, chest pain, difficulty breathing, nausea, vomiting, abdominal pain.    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   Hyperlipidemia    Osteoarthritis    PONV (postoperative nausea and vomiting)    Primary osteoarthritis of right hip 07/05/2014   Xray 06/2014    Restless leg    S/P closed reduction of dislocated total hip prosthesis 12/04/2016   Syncope    Tremor    Trigger finger, right index finger 08/19/2016   Trigger thumb of right hand 09/29/2013    Past Surgical History:  Procedure Laterality Date   HYSTERECTOMY  1989   TOTAL   BREAST BIOPSY  2000   RIGHT   FOOT FRACTURE SURGERY  2001   RIGHT   ANKLE FRACTURE SURGERY  2001   RIGHT   ROTATOR CUFF REPAIR  2002   RIGHT   CORNEAL TRANSPLANT  2011   right   TOTAL SHOULDER ARTHROPLASTY  12-08-2009   RIGHT   LUMBAR LAMINECTOMY  2012   ARTHROPLASTY HIP TOTAL Right 09/17/2016   Procedure: ARTHROPLASTY, ACETABULAR AND PROXIMAL FEMORAL PROSTHETIC REPLACEMENT (TOTAL HIP ARTHROPLASTY), WITH OR WITHOUT AUTOGRAFT OR ALLOGRAFT;  Surgeon: Ozell Deward Lace, MD;  Location: [facility]  OR;  Service: Orthopedics;  Laterality: Right;   NEUROPLASTY &/OR TRANSPOSITION MEDIAN NERVE AT CARPAL TUNNEL Left 04/01/2017   Procedure: NEUROPLASTY AND/OR TRANSPOSITION; MEDIAN NERVE AT CARPAL TUNNEL;  Surgeon: Marye Algie Rush, MD;  Location: [facility] OR;  Service: Orthopedics;  Laterality: Left;   NEUROPLASTY &/OR TRANSPOSITION MEDIAN NERVE AT CARPAL TUNNEL Right 04/14/2018   Procedure: NEUROPLASTY AND/OR TRANSPOSITION; MEDIAN NERVE AT CARPAL TUNNEL;  Surgeon: Marye Algie Rush, MD;  [facility];  Service: Orthopedics;  Laterality: Right;   CORNEAL TRANSPLANT DSAEK Right 02/08/2022   Procedure: KERATOPLASTY, DSAEK ENDOTHELIAL (CORNEAL TRANSPLANT);  Surgeon: Trudy Willma Hamel, MD;  [facility];  Service: Ophthalmology;  Laterality: Right;   CORNEAL RELAXING INCISION TO CORRECT INDUCED ASTIGMATISM Left 04/09/2024   Procedure: CORNEAL RELAXING INCISION FOR CORRECTION OF SURGICALLY INDUCED ASTIGMATISM;  Surgeon: Trudy Willma Hamel, MD;  [facility];  Service: Ophthalmology;  Laterality: Left;   APPENDECTOMY     BACK SURGERY     CHOLECYSTECTOMY     FRACTURE SURGERY     JOINT REPLACEMENT     TONSILLECTOMY       Physical Exam   BP 103/57 (BP Location: Left upper arm, Patient Position: Sitting)   Pulse 103   Temp 36.6 C (97.9 F) (Tympanic)   Resp 18   SpO2 97%    Physical Exam Vitals and nursing  note reviewed.  Constitutional:      General: She is not in acute distress.    Appearance: She is well-developed.  HENT:     Head: Normocephalic and atraumatic.  Eyes:     Conjunctiva/sclera: Conjunctivae normal.  Cardiovascular:     Rate and Rhythm: Normal rate and regular rhythm.     Heart sounds: No murmur heard. Pulmonary:     Effort: Pulmonary effort is normal. No respiratory distress.     Breath sounds: Normal breath sounds.  Abdominal:     Palpations: Abdomen is soft.     Tenderness: There is no abdominal  tenderness.  Genitourinary:    Comments: PE Chaperone note: A chaperone was included for sensitive portions of the exam- staff member name: Junior  Brown stool, no rectal prolapse, no vaginal prolapse, old skin tags, no hemorrhoids, no palpable mass  Musculoskeletal:        General: No swelling.     Cervical back: Neck supple.  Skin:    General: Skin is warm and dry.     Capillary Refill: Capillary refill takes less than 2 seconds.  Neurological:     Mental Status: She is alert.  Psychiatric:        Mood and Affect: Mood normal.     Medical Decision Making   Assessment and Plan This is a 89 year old female with past medical history of hypothyroidism, A-fib, hyperlipidemia who is presenting to the ED for evaluation of reducible rectal mass that she noticed 2 days ago.     DDX: Rectal prolapse, vaginal prolapse, hemorrhoids, mass  Diagnostics:  Orders Placed This Encounter  Procedures   Ambulatory Referral to Colorectal Surgery   Ambulatory Referral to Physical Therapy   Therapeutics: Medications - No data to display   Vital signs reviewed. Data Reviewed. Imaging that was obtained was interpreted by radiologist and reviewed by myself.   Exam and history most consistent with partial rectal prolapse that is now currently resolved.  No evidence of acute thrombosed hemorrhoids or rectal mass.  Normal vaginal vulva with no vaginal prolapse. Discussed treatment for this including pelvic exercises and she was given a physical therapy referral for pelvic eval and treat.  She was also given ambulatory referral to colorectal surgery.  She was instructed to increase her water intake and her fiber intake including fruits, vegetables, and over-the-counter Metamucil or fiber supplements.  Instructed to return for irreducible mass,, painful, or dark or discolored, or fevers.  All questions answered.  No concerns.  Patient discharged in stable condition.     Medical Complexity:    New  and requires workup: Yes   Pertinent labs & imaging results that were available during my care of the patient were reviewed by me and considered in my medical decision making: Yes   I obtained history from someone other than the patient: No   I reviewed previous medical records: Yes     I independently visualized image(s), tracing(s), and/or specimen(s): Yes   I discussed the patient with another provider: No   I consulted clinical decision tools: No    ED Clinical Impression  1. Rectal prolapse       Lani Ozell Pouch, MD PGY-2  This note was written using voice-to-text dictation software, please excuse any typos or errors that may result.     Pouch Lani Ozell, MD Resident 04/17/24 1345    Pouch Lani Ozell, MD Resident 04/17/24 1345    Raechel Erla Geralds, MD 04/18/24 [phone]  "

## 2024-06-23 NOTE — ED Provider Notes (Signed)
 Initial Provider Assessment  Interpreter used: Level of Interpreter Services: No interpreter needed (no language barrier) Historian: spouse/SO and relative(s) son  HPI: Pt is a 89 y.o. female with history as listed below who presents to the ED with Wound Check, Generalized Weakness, and Diarrhea. Patient presents with her husband and son with concern for dehydration. Over the past few weeks she has had increased episodes of explosive watery diarrhea and generalized weakness. She also has multiple sacral wounds and reportedly her home health nurse saw them today and told her she needed to present to the ED. No fevers, chills, nausea, vomiting, or abdominal pain. She has had 2 ground level falls over the past few days due to weakness.  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   Hyperlipidemia    Osteoarthritis    PONV (postoperative nausea and vomiting)    Primary osteoarthritis of right hip 07/05/2014   Xray 06/2014    Restless leg    S/P closed reduction of dislocated total hip prosthesis 12/04/2016   Syncope    Tremor    Trigger finger, right index finger 08/19/2016   Trigger thumb of right hand 09/29/2013    Past Surgical History:  Procedure Laterality Date   HYSTERECTOMY  1989   TOTAL   BREAST BIOPSY  2000   RIGHT   FOOT FRACTURE SURGERY  2001   RIGHT   ANKLE FRACTURE SURGERY  2001   RIGHT   ROTATOR CUFF REPAIR  2002   RIGHT   CORNEAL TRANSPLANT  2011   right   TOTAL SHOULDER ARTHROPLASTY  12-08-2009   RIGHT   LUMBAR LAMINECTOMY  2012   ARTHROPLASTY HIP TOTAL Right 09/17/2016   Procedure: ARTHROPLASTY, ACETABULAR AND PROXIMAL FEMORAL PROSTHETIC REPLACEMENT (TOTAL HIP  ARTHROPLASTY), WITH OR WITHOUT AUTOGRAFT OR ALLOGRAFT;  Surgeon: Ozell Deward Lace, MD;  Location: [facility];  Service: Orthopedics;  Laterality: Right;   NEUROPLASTY &/OR TRANSPOSITION MEDIAN NERVE AT CARPAL TUNNEL Left 04/01/2017   Procedure: NEUROPLASTY AND/OR TRANSPOSITION; MEDIAN NERVE AT CARPAL TUNNEL;  Surgeon: Marye Algie Rush, MD;  Location: [facility];  Service: Orthopedics;  Laterality: Left;   NEUROPLASTY &/OR TRANSPOSITION MEDIAN NERVE AT CARPAL TUNNEL Right 04/14/2018   Procedure: NEUROPLASTY AND/OR TRANSPOSITION; MEDIAN NERVE AT CARPAL TUNNEL;  Surgeon: Marye Algie Rush, MD;  [facility];  Service: Orthopedics;  Laterality: Right;   CORNEAL TRANSPLANT DSAEK Right 02/08/2022   Procedure: KERATOPLASTY, DSAEK ENDOTHELIAL (CORNEAL TRANSPLANT);  Surgeon: Trudy Willma Hamel, MD;  [facility];  Service: Ophthalmology;  Laterality: Right;   CORNEAL RELAXING INCISION TO CORRECT INDUCED ASTIGMATISM Left 04/09/2024   Procedure: CORNEAL RELAXING INCISION FOR CORRECTION OF SURGICALLY INDUCED ASTIGMATISM;  Surgeon: Trudy Willma Hamel, MD;  [facility];  Service: Ophthalmology;  Laterality: Left;   APPENDECTOMY     BACK SURGERY     CHOLECYSTECTOMY     FRACTURE SURGERY     JOINT REPLACEMENT     TONSILLECTOMY       Vitals:   06/23/24 1144  BP: 109/68  BP Location: Right upper arm  Patient Position: Sitting  Pulse: 99  Resp: 18  Temp: 36.5 C (97.7  F)  TempSrc: Tympanic  SpO2: 99%   Focused Physical Exam: Gen: No Acute Distress HEENT: Normocephalic and atraumatic CV: Irregularly irregular Lung: No respiratory distress Abd: Soft, non-tender, non-distended Neuro: Alert and awake, moving all extremities well  Medical Decision Making and Plan: Given the patients initial provider assessment, the following diagnostic evaluation and therapeutic interventions have been ordered. The patient will be placed in the  appropriate treatment space, once one is available, to complete the evaluation and treatment.  I have discussed the plan of care with the patient. Prior notes reviewed: yes Tests ordered:  Orders Placed This Encounter  Procedures   Respiratory Virus, Basic Panel, PCR   CT brain without contrast   Shock Panel, Venous   Complete Blood Count (CBC) with Differential   Comprehensive Metabolic Panel (CMP)   Magnesium    Activated Partial Thromboplastin Time (APTT)   Prothrombin Time (INR)   Contact isolation status   Droplet isolation status   POC GLUCOSE   ECG 12-lead   Type And Screen   Treatments ordered:  Medications  lactated ringers  bolus 1,000 mL (has no administration in time range)     Laurian Penne Birmingham, MD 06/23/24 1155

## 2024-06-23 NOTE — ED Provider Notes (Signed)
 " [facility] EMERGENCY DEPT  ED Provider Note History   Chief Complaint  Patient presents with   Wound Check   Generalized Weakness   Diarrhea    History of Present Illness Cassandra Glover is a 89 year old female with Parkinson's disease, afib on diltiazem  and apixaban, CHF (EF 45%), HLD, hypothyroidism, possible rectal prolapse, Healed stress fracture of left fibula and left ankle, t-spine stage 4 pressure ulcer, osteoporosis who presents with diarrhea, dehydration, and weakness. She has been experiencing watery diarrhea for approximately one month, which began after starting stool softeners for hemorrhoids. She and her family are concerned about significant fluid loss and possible dehydration. Despite increasing her intake of fluids like Gatorade, she continues to feel weak and has experienced a noticeable decline in her condition. She has experienced increased weakness and has fallen a couple of times in the past week. In the past few days, she has developed a red and warm area on her hip, which her daughter has been cleaning and bandaging. She is wheelchair-dependent and previously could assist with transfers from the wheelchair to the toilet or bed, but she is no longer able to do so. Two weeks ago, she was able to go to the bathroom independently, but now she requires assistance.  She has a history of hemorrhoids vs rectal prolapse, which have been problematic, especially when she does not take stool softeners. Her daughter reports that a 'knot' or 'ball' protrudes and sometimes retracts on its own. The stool softeners were initially started to manage this condition.  She felt hot a couple of days ago but did not have a measured fever. No current diarrhea, abdominal pain, nausea, vomiting, or any red or black stools. No recent respiratory symptoms like coughing or sneezing.  Level of Interpreter Services: No interpreter needed (no language barrier)  Past Medical History:  Diagnosis Date    A-fib (CMS/HHS-HCC)    Abdominal bruit    CAD (coronary artery disease)    Cerebrovascular disease    Chest heaviness    Fuch's endothelial dystrophy    H/O cardiac catheterization 06/06/2011   H/O osteoporosis    Heart disease    History of total replacement of right hip 10/02/2016   Hyperlipidemia    Osteoarthritis    PONV (postoperative nausea and vomiting)    Primary osteoarthritis of right hip 07/05/2014   Xray 06/2014    Restless leg    S/P closed reduction of dislocated total hip prosthesis 12/04/2016   Syncope    Tremor    Trigger finger, right index finger 08/19/2016   Trigger thumb of right hand 09/29/2013   Past Surgical History:  Procedure Laterality Date   HYSTERECTOMY  1989   TOTAL   BREAST BIOPSY  2000   RIGHT   FOOT FRACTURE SURGERY  2001   RIGHT   ANKLE FRACTURE SURGERY  2001   RIGHT   ROTATOR CUFF REPAIR  2002   RIGHT   CORNEAL TRANSPLANT  2011   right   TOTAL SHOULDER ARTHROPLASTY  12-08-2009   RIGHT   LUMBAR LAMINECTOMY  2012   ARTHROPLASTY HIP TOTAL Right 09/17/2016   Procedure: ARTHROPLASTY, ACETABULAR AND PROXIMAL FEMORAL PROSTHETIC REPLACEMENT (TOTAL HIP ARTHROPLASTY), WITH OR WITHOUT AUTOGRAFT OR ALLOGRAFT;  Surgeon: Ozell Deward Lace, MD;  Location: [facility] OR;  Service: Orthopedics;  Laterality: Right;   NEUROPLASTY &/OR TRANSPOSITION MEDIAN NERVE AT CARPAL TUNNEL Left 04/01/2017   Procedure: NEUROPLASTY AND/OR TRANSPOSITION; MEDIAN NERVE AT CARPAL TUNNEL;  Surgeon: Marye Algie Rush,  MD;  Location: [facility];  Service: Orthopedics;  Laterality: Left;   NEUROPLASTY &/OR TRANSPOSITION MEDIAN NERVE AT CARPAL TUNNEL Right 04/14/2018   Procedure: NEUROPLASTY AND/OR TRANSPOSITION; MEDIAN NERVE AT CARPAL TUNNEL;  Surgeon: Marye Algie Rush, MD;  Location: [facility];  Service: Orthopedics;  Laterality: Right;   CORNEAL TRANSPLANT DSAEK Right 02/08/2022   Procedure: KERATOPLASTY, DSAEK ENDOTHELIAL (CORNEAL  TRANSPLANT);  Surgeon: Trudy Willma Hamel, MD;  [facility];  Service: Ophthalmology;  Laterality: Right;   CORNEAL RELAXING INCISION TO CORRECT INDUCED ASTIGMATISM Left 04/09/2024   Procedure: CORNEAL RELAXING INCISION FOR CORRECTION OF SURGICALLY INDUCED ASTIGMATISM;  Surgeon: Trudy Willma Hamel, MD;  [facility];  Service: Ophthalmology;  Laterality: Left;   APPENDECTOMY     BACK SURGERY     CHOLECYSTECTOMY     FRACTURE SURGERY     JOINT REPLACEMENT     TONSILLECTOMY     Family History  Problem Relation Age of Onset   Stroke Mother    Irregular heart beat Mother    Myocardial Infarction (Heart attack) Father    Heart disease Father    Myocardial Infarction (Heart attack) Brother    Heart disease Brother    Sleep apnea Brother    Heart disease Brother    Heart disease Brother    Anesthesia problems Neg Hx    Malignant hyperthermia Neg Hx    Glaucoma Neg Hx    Macular degeneration Neg Hx    Social History   Socioeconomic History   Marital status: Married    Spouse name: Cassandra Glover   Number of children: 2   Years of education: 12+  Occupational History   Occupation: Retired diplomatic services operational officer  Tobacco Use   Smoking status: Never   Smokeless tobacco: Never  Vaping Use   Vaping status: Never Used  Substance and Sexual Activity   Alcohol use: Not Currently   Drug use: No   Sexual activity: Defer  Social History Narrative   Lives in [place] with Husband. Retired hotel manager.      10/28/2023   Likes/Enjoys/What fills your day?:    Military Service: None   Driving Status: Reports does not drive   Home: Two Story - patient has basement with rider to get down the stairs   Your Bedrooms is on: First Level   Fewest Steps to enter the home: 0 - ramp to get in the home with railings on both sides   Other persons in the home: husband   Pets: none   Medical equipment you use daily: Rollator   Medical equipment available in  the home: Blood Pressure Machine, Shower Chair/Bench, Wheelchair, Manual, and 5 grab bars in both bathrooms   Audiology/Hearing: Denies any issues in Hearing .   Dermatology: Denies areas of concern. Last screening Date: March 2025 Screening is: Up to date, Provider Name: [facility]   Dental: no significant dental problems. Last screening Date: November 2024. Screening is: Up to date, Provider Name: Dr. Ezzie Shutter   Vision: Wears prescription glasses. Last screening Date: February 2025 Screening is: Up to date, Provider Name: [facility]       Social Drivers of Health   Financial Resource Strain: Low Risk  (02/19/2024)   Overall Financial Resource Strain (CARDIA)    Difficulty of Paying Living Expenses: Not hard at all  Food Insecurity: No Food Insecurity (02/19/2024)   Hunger Vital Sign    Worried About Running Out of Food in the Last Year: Never true  Ran Out of Food in the Last Year: Never true  Transportation Needs: No Transportation Needs (02/19/2024)   PRAPARE - Administrator, Civil Service (Medical): No    Lack of Transportation (Non-Medical): No  Housing Stability: Low Risk  (02/19/2024)   Housing Stability Vital Sign    Unable to Pay for Housing in the Last Year: No    Number of Times Moved in the Last Year: 0    Homeless in the Last Year: No   Review of Systems  Constitutional:  Negative for chills and fever.  HENT:  Negative for sneezing and sore throat.   Respiratory:  Negative for shortness of breath.   Cardiovascular:  Negative for chest pain.  Gastrointestinal:  Positive for diarrhea. Negative for abdominal pain, blood in stool, nausea and vomiting.  Skin:  Positive for wound.    Physical Exam  BP (!) 157/83   Pulse 97   Temp 36.6 C (97.9 F) (Tympanic)   Resp 21   SpO2 96%  Physical Exam Constitutional:      General: She is not in acute distress.    Appearance: She is not diaphoretic.  Eyes:     Conjunctiva/sclera:  Conjunctivae normal.  Cardiovascular:     Rate and Rhythm: Normal rate. Rhythm irregular.  Pulmonary:     Effort: Pulmonary effort is normal. No respiratory distress.  Abdominal:     General: Abdomen is flat.     Palpations: Abdomen is soft.     Tenderness: There is no abdominal tenderness.  Skin:    General: Skin is warm and dry.     Findings: Erythema (eryethema and warmth of the R thigh) present.  Neurological:     Mental Status: She is alert.  Psychiatric:        Mood and Affect: Mood normal.        Behavior: Behavior normal.      Procedures  Procedures   Results EKG: No signs of myocardial infarction; mild tachycardia (06/23/2024) Medical Decision Making   Medical Decision Making Jennessy Sandridge is a 89 year old female with Parkinson's disease, afib on diltiazem  and apixaban, CHF (EF 45%), HLD, hypothyroidism, possible rectal prolapse, Healed stress fracture of left fibula and left ankle, t-spine stage 4 pressure ulcer, osteoporosis who presented with progressive weakness, recurrent falls, and decreased ability to transfer, as well as a month-long history of watery diarrhea and concern for dehydration.  Patient family reports a history concerning for rectal prolapse requiring stool softeners which is leading to this diarrhea.  On initial presentation, patient alert, conversational, chronically ill-appearing but not in acute distress.  Vital signs blood pressure 157/83, heart rate 97 bpm, satting 93% on room air, afebrile.  On physical exam, patient has erythema and warmth to the right thigh and the left foot concerning for cellulitis and signs concerning for dehydration.  Chronic T-spine pressure wound well-healing.    Differential diagnosis includes, but is not limited to: - Dehydration secondary to diarrhea: Dehydration is suspected due to ongoing watery diarrhea, oral mucosal dryness, tachycardia, and reported decreased oral intake - Medication-induced diarrhea: Diarrhea is  likely related to the use of stool softeners prescribed for management of hemorrhoids, as symptoms correlate with medication use and resolve with dose reduction. - Electrolyte abnormalities: Concern due to diarrhea, dehydration, and weakness - Functional decline and falls: Recent falls and worsening functional status are attributed to progressive weakness, dehydration, and underlying neurological and orthopedic comorbidities. - Cellulitis: New erythema, and warmth on the  right thigh and left foot in the setting of recent fall  Blood work obtained which was significant for leukocytosis of 15.3.  Otherwise workup unremarkable with no significant electrolyte abnormalities, stable hemoglobin, CT brain without acute intracranial abnormalities. General medicine was consulted who agreed patient be a good candidate for admission for treatment of cellulitis, dehydration, and PT OT in the setting of weakness and frequent falls.  Dispo: Admit to internal medicine    Medical Complexity:    New and requires workup.     Pertinent labs & imaging results that were available during my care of the patient were reviewed by me and considered in my medical decision making.     I obtained history from someone other than the patient.     I reviewed previous medical records.     I discussed the patient with another provider.    ED Course as of 06/23/24 2029  Tue Jun 23, 2024  1259 CT brain without contrast 1.  No acute intracranial abnormalities.  2.  Left sphenoid sinus air-fluid level is nonspecific but can be seen in acute sinusitis.    [TP]  1401 pH, Venous: 7.39 [TP]  1401 Lactate, Venous: 1.5 [TP]  1401 POC Glucose: 116 [TP]  1500 I assumed care on 06/23/2024 at 3:00 PM at shift change. Per verbal report from the previous care team:   Anticipate admission. 69F with Parkinson's, AF, CAD, bedbound. Increased R thigh pressure sore redness. Concern for newer surrounding cellulitis. Chronic diarrhea with an  aggressive bowel regimen, is on senna and miralax. Cannot get to the bathroom.  [CN]  1501 HR 110s AF at present, typically takes cardizem .  [CN]    ED Course User Index [CN] Hollie Lonni Agent, MD [TP] Arnell Rosebush, MD      Medications Administered in the Emergency Department   sodium chloride  0.9% infusion (has no administration in time range) potassium chloride  10 mEq in 100 mL IVPB (has no administration in time range) levothyroxine  (SYNTHROID ) tablet 25 mcg (has no administration in time range)   And levothyroxine  (SYNTHROID ) tablet 50 mcg (has no administration in time range)   And levothyroxine  (SYNTHROID ) tablet 50 mcg (has no administration in time range) lactated ringers  bolus 1,000 mL (0 mLs Intravenous Stopped 06/23/24 1519)   ED Clinical Impression  1. Generalized weakness      ED Disposition  Admit          This note has been created using automated tools and reviewed for accuracy by ROSEBUSH ARNELL.   AI Note Feedback-- Point of Care Hunt Arnell Rosebush, MD Resident 06/23/24 2029  "

## 2024-06-25 NOTE — Discharge Summary (Addendum)
 "  [facility] Discharge Summary  Admit Date: 06/23/2024 Discharge Date: 06/30/2024  Admitting Physician: Lorane Ruth, MD Discharge Physician: Edgar Marti Justice, MD  Primary Care Provider: Jyl Heron Haff, MD, Phone [phone]  Discharge Destination: Skilled nursing facility: [facility]  Admission Diagnoses:  Generalized weakness [R53.1]  Discharge Diagnoses:  Principal Problem:   Functional diarrhea Active Problems:   Chronic atrial fibrillation (CMS/HHS-HCC)   Hyperlipidemia   Osteoarthritis   Parkinsonian tremor (CMS/HHS-HCC) Resolved Problems:   * No resolved hospital problems. *  Primary Diagnosis: Admitted for diarrhea/fatigue/worsening functional status  Changes Made (with rationale):  Increased her diltiazem  to 180mg  given hrs in the 120s with improvement into he 100s. Given age did not proceed with haste but consider up titration and certainly get outpatient cards fu  Discontinued apixiban given the patient is on primidone , discussed risk as below, patient selected lovenox , dosing with pharmacy  To-Do List (incidental findings, follow-up studies, etc.): PT Ensure PRN bowel reg for her rectal prolapse and reschedule her [facility] consultation Cardiology fu Ongoing discussion about functional status Resume aldactone when bp tolerates  Anticipatory Guidance for Outpatient Care:  We discussed overall health satus that she may not improve functionally and we would either be looking at palliative care or max in home healh. Would be beneficial for palliative care to see her in the outpatient setting    Results Pending at Discharge:  None Please see phone numbers at end of this summary for lab contact information.   Follow-up/Care Transition Plan: Sched. appts: Future Appointments  Date Time Provider Department Center  07/21/2024  2:45 PM Gupta, Divakar, MD [facility] EYE  10/06/2024  3:00 PM Jacumin, Erika Leigh, PA [facility] [place]   11/02/2024 10:00 AM [facility] NURSE [place] [facility] [facility] [place]  11/02/2024 11:00 AM Jyl Heron Haff, MD [facility] [place]  11/04/2024 10:30 AM [facility] [place] C  11/04/2024 11:00 AM [facility] DXA 1 KCWRHEUMOS [place] C  11/19/2024 10:30 AM Brendia Calton Squires, PA [facility]WENDDIAB MARYL C  07/21/2025 10:40 AM Marsa Darice Dayhoff, MD ARR CARD 4 [place]    Follow-up info: [facility] [place] [address] [place]  [address] [phone]        Allergies/Intolerances:  Allergies  Allergen Reactions   Sulfa (Sulfonamide Antibiotics) Hives   Simvastatin Other (See Comments)    Joint pains     New Adverse Drug Events: none  Medications:     Current Discharge Medication List     PAUSE taking these medications      Instructions  spironolactone 25 MG tablet Wait to take this until your doctor or other care provider tells you to start again. Quantity: 45 tablet Refills: 3 What changed: when to take this  Commonly known as: ALDACTONE Take 0.5 tablets (12.5 mg total) by mouth once daily       START taking these medications      Instructions  enoxaparin  60 mg/0.6 mL injection syringe Refills: 0 Start taking on: July 01, 2024  Commonly known as: LOVENOX  Inject 0.5 mLs (50 mg total) subcutaneously once daily for 30 days in abdomen, rotating site with each injection. Last time this was given: 50 mg on June 30, 2024  8:53 AM   sennosides-docusate 8.6-50 mg tablet Refills: 0  Commonly known as: SENOKOT-S Take 1 tablet by mouth 2 (two) times daily as needed for Constipation Take prn in order to help with rectal prolapse Last time this was given: 2 tablets on  June 28, 2024  5:12 PM       These medications have been CHANGED      Instructions  atorvastatin 20 MG tablet Quantity: 90 tablet Refills: 3 What changed: when to take this  Commonly known as: LIPITOR TAKE 1 TABLET BY MOUTH ONCE   DAILY Doctor's comments: Please send a replace/new response with 90-Day Supply if appropriate to maximize member benefit. Requesting 1 year supply. Last time this was given: 20 mg on June 29, 2024  8:03 PM   brimonidine  0.2 % ophthalmic solution Quantity: 5 mL Refills: 12 What changed: additional instructions  Commonly known as: ALPHAGAN  Place 1 drop into the right eye 2 (two) times daily Last time this was given: 1 drop on June 30, 2024  9:07 AM   dilTIAZem  180 MG CD capsule Refills: 0 What changed:  medication strength how much to take when to take this  Commonly known as: CARDIZEM  CD Take 1 capsule (180 mg total) by mouth at bedtime Last time this was given: 180 mg on June 29, 2024  8:03 PM   dorzolamide -timoloL  22.3-6.8 mg/mL ophthalmic solution Quantity: 10 mL Refills: 12 What changed: additional instructions  Commonly known as: COSOPT  Place 1 drop into both eyes 2 (two) times daily Last time this was given: 1 drop on June 30, 2024  9:02 AM   latanoprost  0.005 % ophthalmic solution Quantity: 2.5 mL Refills: 11 What changed: additional instructions  Commonly known as: XALATAN  Place 1 drop into both eyes at bedtime Last time this was given: 1 drop on June 29, 2024  8:07 PM   prednisoLONE  acetate 1 % ophthalmic suspension Quantity: 10 mL Refills: 5 What changed: additional instructions  Commonly known as: PRED FORTE  Place 1 drop into both eyes once daily Last time this was given: 1 drop on June 30, 2024  8:54 AM       CONTINUE taking these medications      Instructions  acetaminophen  500 MG tablet Refills: 0  Commonly known as: TYLENOL  Take 1,000 mg by mouth every 8 (eight) hours as needed for Pain Last time this was given: 975 mg on June 30, 2024  6:07 AM   calcium carbonate-vitamin D3 500 mg-3.125 mcg (125 unit) per tablet Refills: 0  Take 1 tablet by mouth 2 (two) times daily with meals Last time this was given: Ask your nurse or  doctor   carbidopa -levodopa  25-100 mg tablet Quantity: 540 tablet Refills: 1  Commonly known as: SINEMET  TAKE 2 TABLETS BY MOUTH 3 TIMES  DAILY Doctor's comments: Please send a replace/new response with 90-Day Supply if appropriate to maximize member benefit. Requesting 1 year supply. Last time this was given: 2 tablets on June 30, 2024  8:53 AM   levothyroxine  25 MCG tablet Quantity: 156 tablet Refills: 3  Commonly known as: SYNTHROID  TAKE 1 TABLET BY MOUTH DAILY ON  TUESDAY AND THURSDAY AND TAKE 2  TABLETS BY MOUTH DAILY ON  MONDAY, WEDNESDAY, FRIDAY ,  SATURDAY , AND SUNDAY Doctor's comments: Please send a replace/new response with 90-Day Supply if appropriate to maximize member benefit. Requesting 1 year supply. Last time this was given: 25 mcg on June 30, 2024  6:07 AM   pramipexole  0.5 MG tablet Quantity: 90 tablet Refills: 3  Commonly known as: MIRAPEX  Take 1 tablet (0.5 mg total) by mouth at bedtime Doctor's comments: Please send a replace/new response with 90-Day Supply if appropriate to maximize member benefit. Requesting 1 year supply. Last time this was given:  0.5 mg on June 29, 2024  8:03 PM   PRESERVISION AREDS-2 soft gel capsule Refills: 0 Generic drug: multivitamin with minerals (EYE)  Take 1 capsule by mouth 2 (two) times daily with meals Last time this was given: 1 capsule on June 30, 2024  8:53 AM   primidone  50 MG tablet Quantity: 180 tablet Refills: 3  Commonly known as: MYSOLINE  TAKE 1 TABLET BY MOUTH TWICE  DAILY Doctor's comments: Please send a replace/new response with 90-Day Supply if appropriate to maximize member benefit. Requesting 1 year supply. Last time this was given: 50 mg on June 30, 2024  8:53 AM   VITAMIN D3 ORAL Refills: 0  Take 2,000 Units by mouth once daily Last time this was given: Ask your nurse or doctor   walker Misc Quantity: 1 each Refills: 0  Commonly known as: HUGO ROLLING WALKER Use 1 piece once  daily. Doctor's comments: Rolling walker with seat       STOP taking these medications    apixaban 2.5 mg tablet Commonly known as: ELIQUIS   doxycycline monohydrate 100 mg Tab tablet   mupirocin  2 % ointment Commonly known as: BACTROBAN    omeprazole 20 MG DR capsule Commonly known as: PriLOSEC   UNABLE TO FIND         Brief History of Present Illness:   Cassandra Glover is a 89 y.o. female with Parkinson's disease, afib, HFrEF, HLD, hypothyroidism, rectal prolapse, osteoporosis that presented with diarrhea for one month after she was started on stool softeners for hemorrhoids/ rectal prolapse. They have held her bowel regimen, including Colace twice daily and Miralax daily. She did not have a bowel movement yesterday, but she had a blowout this morning.   Her family has been encouraging her to increase her fluid intake, however, she has continued to feel weak and is declining from her baseline functional status. Her husband is her primary caregiver at home. They also have a home health nurse that visits twice weekly for wound care to her sacral wound, but otherwise have no other support. She gets around in a wheelchair. She is usually able to transfer to the bed or toilet on her own, but over the last 2 weeks, she has not been able to do this.   She denies fever/chills, chest pain, palpitations, abdominal pain, nausea/vomiting. She states her appetite is about the same.    In the ED, temp was 98.1, HR 122, BP 141/86, 93% RA. Labs with WBC 15.3, Hgb 12, plts 354. Na 130, K 3.5, Cl 97, bicarb 22, BUN 11, and Cr 0.6, glucose 100. CT brain without acute intracranial abnormalities. Basic respiratory viral panel was negative. She received 1 L IVF.    _____________________   Hospital Course by Problem:  Diarrhea H/o hemorrhoids Rectal prolapse Initial diarrhea possibly from stool softeners for rectal prolapse. No obvious infectious concerns. Now has resolved and no BM in 2+  days - started senna-doc as she does have rectal prolapse when not on stool softeners, made this PRN on dc as she was having too many stools. This needs to be adjusted for her -needs [facility] fu. An appt was canceled as she was inpatient  #Acute hypoxemic respiratory failure-resolved #Small bilateral pleural effusions 2/2 #HFREF exacerbation-resolved Patient developed cough, sob and o2 requirement here in the hospital. Flu/covid negative. Not really infectious by history. Dry cough. Patient with history of HFREF as below and aldactone has been held. Intermittent lasix use over the years but not on it  at home. Xray showing findings c/w edema and bilateral pleural effusions. She improved on diuresis greatly and did not require a maintenance diuretic. Repeat TTE shows similar EF of 50% but new severe TR and pulm HTN rvsp of 42 likely type II. She should have ongoing cardiology fu, referral placed but please help schedule. No maintenance diuretic rx but needs to monitor weights and dose lasix 20-40mg  prn. Holding aldactone on DC but please restart if BP tolerates it    Leukocytosis, resolved Possible left arm cellulitis resolved WBC 14 on admission. No urinary symptoms. Basic RVP negative. Sacral wound does not appear infected. Had left arm erythema and warmth so concerned for cellulitis. LUE US  with no DVT. Initially started on ceftriaxone. Had minimal improvement in exam but WBC improved. Deescalated to keflex and completed 5 day course inpatient   Debility Recent falls at home. Husband is 95 and is having difficulty caring for her including transfers. PT/OT consulted, recommending SNF. Long discussion had about realistic expectation of rehab and the patient may not improve all the way at which point it would be appropriate to involve palliative vs max home health. For now they wish to try and see if she can be rehabed enough to go home again   Parkinson's disease - Sinemet  2 tabs TID -  Primidone  50mg  BID   Afib Some tachycardia to 120s but no significant RVR. Likely related to poor PO intake, possible infection. Possibly volume was exacerbating this as well as following diuresis HR improved to the 80s on 1/3 but quickly rebounded. Also on 1/3 the [facility] pharmacist noted that primidone  functionally decreases the levels of eliquis to zero. The patient was previously on coumadin per her report and was switched to eliquis to not have INR checks. Discussed risk and benefit of ac with patient and husband. CHADSVASC 5 indicating yearly risk of 7.2-10% for stroke, HASBLED 2-3 (4.1-5.8)%. Patient would prefer to remain on [facility], chose lovenox . Dose reduction with pharmacy help.    HLD - Atorvastatin 20mg  qHS   Hypothyroidism - TFTs wnl - Levothyroxine  25mcg on T/Th, 50mcg on other days of the week   Glaucoma - Alphagan  eye drops right eye BID - Cosopt  eye drops BID - Latanoprost  eye drops qHS - Prednisolone  eye drops daily   Osteoporosis Osteoarthritis - Continue calcium-vitamin D supplementation - Scheduled Tylenol  975mg  TID   Restless legs - Pramipexol 0.5mg  qHS   Sacral wound Abrasions on buttock and foot Wound on back Patient with pressure wounds on buttock and foot. On her mid back she has a largely well healed wound from a prior surgery. Some granulation tissue but much improved from prior. - Wound care consulted, instruction in dc packet - Nutrition consulted       Surgeries and Procedures Performed:    _____________________  Discharge Exam:  BP 126/73 (BP Location: Left upper arm, Patient Position: Lying)   Pulse 108   Temp 36.6 C (97.9 F) (Oral)   Resp 18   Ht 154.9 cm (5' 1)   Wt 61.6 kg (135 lb 12.9 oz)   SpO2 93%   BMI 25.66 kg/m  O2 Device: Nasal cannula (06/26/24 2342) BP 126/73 (BP Location: Left upper arm, Patient Position: Lying)   Pulse 108   Temp 36.6 C (97.9 F) (Oral)   Resp 18   Ht 154.9 cm (5' 1)   Wt 61.6 kg (135 lb  12.9 oz)   SpO2 93%   BMI 25.66 kg/m  General appearance: alert, cachectic,  and cooperative Head: temporal wasting Back: mid back wound well healed compared to prior pictures, no e/o infection Lungs: clear to auscultation bilaterally Heart: irregularly irregular systolic murmur Abdomen: soft, non-tender; bowel sounds normal; no masses,  no organomegaly Extremities: no edema, left foot pressure ulcer   Pertinent Lab Testing: Recent Labs  Lab 06/28/24 0701 06/29/24 0725 06/30/24 0609  NA 136 131* 133*  K 3.8 3.6 4.2  CL 100 99 101  CO2 28 24 26   BUN 10 10 13   CREATININE 0.5 0.5 0.6  GLUCOSE 90 92 87  CALCIUM 8.8 8.5* 8.5*   Recent Labs  Lab 06/23/24 1331  AST 38  ALT 8*  ALKPHOS 114*  TBILI 1.0    Recent Labs  Lab 06/28/24 0701 06/29/24 0725 06/30/24 0609  WBC 11.6* 10.2* 8.8  HGB 10.6* 10.5* 10.5*  HCT 32.0* 31.1* 32.3*  PLT 427 410 391   Recent Labs  Lab 06/23/24 1331  APTT 35.6  INR 1.4*     Other Pertinent Labs:    Micro:  No results found for the last 90 days.      Pertinent Imaging:   Echo complete Result Date: 06/29/2024               [employer] SYSTEM                         JULIETTE STANDRE                             [facility]                              [id number]                                                                               DOB: 1931/09/11  Age: 65                  ECHO-DOPPLER REPORT                             Date: 06/29/2024  Female                                                                                    Inpatient                                                                       LOCATION: [facility]                                                                            MD1: PARSA RAIS GHASEM            STUDY: ECHO COMPLETE                              SOUND QLTY: Moderate                      ECHO: Yes                                           STRAIN: Yes                          COLOR: Yes                                               3D: Yes                        DOPPLER: Yes                                               BP: 133 / 87                 RV BIOPSY: No                                                HR: 87 BPM                    CONTRAST: No  Height: 61 in                       MEDIUM: N/A                                           Weight: 134 lbs                    MACHINE: CDU - EPIQ-4                                     BSA: 1.6                   ------------------------------------------------------------------------------------------     History: Hypoxemia     Reason: Assess LV function  Indication: R09.02- Hypoxemia.                                                                  CONCLUSION ------------------------------------------------------------------------------- NORMAL LEFT VENTRICULAR SYSTOLIC FUNCTION WITH NO LVH ESTIMATED EF: 50%, CALC EF(3D): 45% DIASTOLIC FUNCTION CAN'T BE DETERMINED NORMAL RIGHT VENTRICULAR SYSTOLIC FUNCTION VALVULAR REGURGITATION: MILD AR, TRIVIAL MR, TRIVIAL PR, SEVERE TR                       ESTIMATED RVSP: 42 mmHg (ABNORMAL) NO VALVULAR STENOSIS                                                                     ------------------------------------------------------------------------------------------ LVEF LOWER LIMIT OF NORMAL - 50%                                                         SEVERE TR                                                                                ELEVATED RVSP IS AT LEAST                                                         Compared with prior Echo study on 03/28/2022 TR WORSENED  ECHOCARDIOGRAPHIC DESCRIPTIONS  ----------------------------------------------------------- AORTIC ROOT         Asc Ao Size: Normal                               Dissection: INDETERMINATE FOR DISSECTION                                             AORTIC VALVE            Leaflets: Tricuspid                               Mobility: Fully Mobile                    Morphology: Normal                                                                                    AR: MILD AR                                       AS: No AS                              AV Mass: No Masses                   LEFT VENTRICLE                Size: Normal                                                                                   LVH: None                                Contraction: Normal                               Closest EF: 50%                                     Calc. EF: 45%(3D)                        LV GLS (TT): -16.9%  Strain Analysis: GLS -16-18% Global longitudinal strain is at the lower limits of                            normal.                                                                            LV Mass: No Masses                         Dias. FxClass: can't be determined                                                      WALL MOTION                            Basal             Mid               Apical              Anterior Septum: Normal            Normal            Normal                Anterior Wall: Normal            Normal            Normal                 Lateral Wall: Normal            Normal            Normal               Posterior Wall: Normal            Normal                                  Inferior Wall: Normal            Normal            Normal              Inferior Septum: Normal            Normal                            Rest Rest Score Index: 1.00 MITRAL VALVE            Leaflets: Normal                                  Mobility: Fully Mobile  Morphology: Normal                                                                                     MR: TRIVIAL MR                                    MS: No MS                            MV masses: No Masses                   LEFT ATRIUM                Size: SEVERELY ENLARGED                                                                  LA masses: No Masses                   MAIN PA                Size: Normal                                  Diameter: 2.2 cm                 PULMONIC VALVE            Leaflets: UNKNOWN                                 Mobility: Fully Mobile                    Morphology: Normal                                                           PR: TRIVIAL PR                                    PS: No PS                            PV masses: No Masses                   RIGHT VENTRICLE                Size: MILDLY ENLARGED  Free Wall: Normal                         Contraction: Normal                                                        TAPSE: 1.6 cm                                                    RV masses: No Masses                   TRICUSPID VALVE            Leaflets: Normal                                  Mobility: Fully Mobile                    Morphology: Normal                                       TR: SEVERE TR                                     TS: No TS                            TV masses: No Masses                   RIGHT ATRIUM                Size: SEVERELY ENLARGED                     RA masses: No Masses                   PERICARDIUM               Fluid: No Effusion        INFERIOR VENA CAVA                Size: Not Seen                          Resp.Collapse: Not seen                                                             RESTING ECHOCARDIOGRAPHIC MEASUREMENTS --------------------------------------------------- AORTA Measurements            Values    Units     Normal Range  Aorta Sin: 3.1       cm        [2.4 -  3.6]                               Asc.Aorta: 2.7       cm        [1.9 - 3.5]                          Asc. Aorta BSA: 1.7       cm/m2     [1.0 - 2.2]                  LEFT VENTRICLE                  LVIDd: 3.8       cm        [3.8 - 5.2]                                   LVIDs: 2.9       cm        [2.2 - 3.5]                               LVIDd/BSA: 2.4       cm/m2                                                     SWT: 0.8       cm        [0.6 - 0.9]                                     PWT: 0.8       cm        [0.6 - 0.9]                                LV EF 3D: 45        %                                                   LV EDV 3D: 77        mL                                                 LV EDV 3Di: 48        mL/m2                                               LV ESV 3D:  42        mL                                                 LV ESV 3Di: 26        mL/m2                                  DIASTOLIC FUNCTION          MV Pk. E Vel.: 112       cm/s                                            MV Pk. A Vel.: 36.2      cm/s                                                   MV E/A: 3.1                                                               MV DT: 137       msec                                           MV Med E' Vel.: 13.9      cm/s                                              MV Med E/e': 8.1                                                       MV Lat E'Vel.: 11.2      cm/s                                              MV Lat E/e': 10                                                          MV Avg E/e': 8.9       cm/s  LEFT ATRIUM                LA Diam: 4.3       cm        [2.7 - 3.8]                                 LA Area: 31        cm2       [<= 20]                                   LA Volume: 101       ml        [18 - 58]                                      LAVi: 63        ml/m2     [16 - 34]                    RIGHT VENTRICLE               RV TAPSE: 1.6       cm                                                     RV Base: 3.8       cm        [2.5 - 4.1]                                  RV Mid: 2.5       cm        [1.9 - 3.5]                  RIGHT ATRIUM                RA Area: 30.4      cm2       [ <= 20]                                  RA Volume: 89        mL                                                       RAVi: 56        mL/m2     [15 - 27]                    Pressures, Gradients, and DOPPLER ECHO --------------------------------------------------- Mitral Valve          MV Pk. E Vel.: 112       cm/s  MV Pk. A Vel.: 36.2      cm/s                                                   MV E/A: 3.1                                                    MV Inflow E Vel.: 112       cm/s                                        MV Annulus E'Vel.: 13.9      cm/s                                                E/E'Ratio: 8                                                Tricuspid Regurgitation Values            TR Pk. Vel.: 3.1       m/s                                               RA Pressure: 3         mmHg                                                     RVSP: 42        mmHg      [facility]                         3D acquisition and reconstructions were performed as part of this examination to more accurately quantify the effects of identified structural abnormalities as part of the exam with independent workstation.        Perform By: Waddell Guys, RDCS                                                    Res. Person: Waddell Guys, RDCS                                              Electronically signed by Rockney Matsu, M.D. on:06/29/2024 5:42:38 PM with  status of Final The images are stored in the [facility] system, please contact the clinical provider for images related to this study.                                                                      X-ray chest single view portable Result Date: 06/26/2024 Examination:  XR CHEST SINGLE VIEW  PORTABLE Patient Name:  Yadhira Moosman Reason provided in the MEDICAL RECORD NUMBER Lung Aeration, R09.02 Hypoxemia The report below contains medical terminology and recommendations which are best discussed with your ordering provider. COMPARISON: May 23, 2023 FINDINGS/IMPRESSION: *  Cardiomegaly *  Mild diffuse interstitial edema *  Small left greater than right pleural effusions *  No pneumothorax Electronically Signed by:  Lamar Campanile, MD, [facility] Electronically Signed on:  06/26/2024 1:56 PM  US  upper extremity venous left Result Date: 06/26/2024 Procedure: US  UPPER EXTREMITY VEIN LEFT Indication: left arm erythema, swelling, M79.89 Other specified soft tissue disorders Comparison: None Technique: Gray-scale, color Doppler, and spectral waveform tracings of left internal jugular vein, brachiocephalic vein, subclavian vein, axillary vein, and brachial veins. In addition, superficial veins including the basilic veins and cephalic veins in the left upper arm were imaged. Spectral Doppler evaluation of the bilateral subclavian veins. Findings: Spectral Doppler evaluation of the bilateral subclavian veins demonstrates symmetric waveforms. LEFT:   Internal jugular vein: Compressible. No thrombus visualized.   Brachiocephalic vein: No thrombus visualized.   Subclavian vein: No thrombus visualized.   Axillary vein: Compressible. No thrombus visualized.   Brachial veins: Compressible. No thrombus visualized.   Visualized portions of the basilic and cephalic veins: Compressible. No thrombus visualized. Impression: No deep venous thrombosis identified in the left upper extremity. Electronically Reviewed by:  Bernardino Bulls, MD, [facility] Electronically Reviewed on:  06/26/2024 10:09 AM I have reviewed the images and concur with the above findings. Electronically Signed by:  Christen Loa, MD, [facility] Electronically Signed on:  06/26/2024 12:08 PM  CT brain without contrast Result Date: 06/23/2024 CT  BRAIN WITHOUT CONTRAST INDICATION: Mental status change, unknown cause, R53.1 Weakness COMPARISON: 03/15/2023. TECHNIQUE: Standard noncontrast brain CT. FINDINGS: Brain Parenchyma: There is no hemorrhage, cerebral edema, acute cortical infarction, mass, mass effect, or midline shift. Diffuse brain parenchymal volume loss. Chronic microvascular white matter ischemic changes. Chronic right caudate infarct. Ventricles and Sulci: Normal for age.  Extra-Axial Spaces: No extra-axial fluid collection. Basal Cisterns: Normal. Paranasal Sinuses: Multifocal mucosal thickening. Left sphenoid sinus air-fluid level. Mastoids: Normal. Orbits: Bilateral lens replacements. Cranium and Bones: Normal. Soft Tissues: Normal. IMPRESSION: 1.  No acute intracranial abnormalities. 2.  Left sphenoid sinus air-fluid level is nonspecific but can be seen in acute sinusitis. Electronically Signed by:  Massie Fireman, MD Electronically Signed on:  06/23/2024 12:58 PM  _____________________  Code Status: DNAR Goals of care were addressed during this admission: as above.   Status on Discharge:  Current activity: Chairfast (06/29/24 2004) Current mobility: Very limited (06/29/24 2004)  Activity Recommendation: activity as tolerated  Other Discharge Instructions: Services setup at discharge: SNF PT/OT Tubes/lines at discharge: None  Diet: Diet regular Oral Supplements - Adult All Supplements; Boost Very High Calorie-Assorted; Breakfast Oral Supplements - Adult All Supplements; Boost Glucose Control-Chocolate; Dinner  Wound Care Order Instructions  Wound Care  As directed       Comments: Left foot and hip - Cleanse wound with hypochlorous acid (Vashe - SAP # [id number]), apply No Sting barrier film (Cavilon - SAP # [id number])  to protect periwound skin,  apply bordered foam (silver Mepilex 4x4 - SAP # [id number]).  Change dressing Q 3 days and PRN for soilage/dislodgement.  Question:  Body Site  Answer:  Left foot and hip       Wound Care  Every Shift       Comments: Implement and continue Pressure Injury Plan of Care per Pressure Injury Prevention Policy and/or Unit Pressure Injury Prevention Bundle Q shift.  (Q2Hr turns, reposition Q1Hr while in chair, use air chair cushion, float heels off bed/chair surface, pad all medical devices using silicone border foam, pad bony prominence with multilayer silicone border foam) 1) Recommend left and right turns only while in bed ideally propping with 2 pillows (1 from shoulder to hip and 1 from lower buttock to thigh) and NEVER placing pillows on each side simultaneously creating a taco effect, which puts more pressure on coccyx/sacrum. 2) Recommend to keep HOB 30 degrees or less unless meal time 3) Recommend air chair cushion (sap#[id number]) when pt OOB to chair 4) Recommend to limit chair time to 1-1.5 hour increments  Question:  Body Site  Answer:  Prevention              _____________________  Time spent on discharge process: 45 minutes    PARSA NELDA PACIFIC, MD [facility]  06/30/2024   Hospital Contact Information:  [place] [facility]) [facility]) [facility])  Pending tests:  Laboratory: [phone] Microbiology: [phone] Pathology: [phone] Radiology: [phone]  General questions: [phone] Pending tests: Laboratory: [phone] Microbiology: [phone] Pathology: [phone] Radiology: [phone]  General questions:  [phone] Pending tests:  Laboratory: [phone] Microbiology: [phone] Pathology: [phone] Radiology: [phone]  General questions:  [phone] Pending tests: Laboratory: [phone]) [phone] Microbiology: [phone] Pathology: [phone] Radiology: [phone]  General questions:  [phone]    "

## 2024-06-30 NOTE — Care Plan (Addendum)
 Pt A&Ox4. VSS on RA.  DC order received. AVS papers printed and given to Jan care.  PIV removed, tip intact. Patient transported by [facility].  Report given to [facility]  Problem: Patients that are at an increased risk for delirium. Goal: Orientation:  The patient will be oriented to person, time and place in the environment Outcome: Met/ Completed Goal: Sleep, rest, and comfort: The patient will be comfortable and rested. Outcome: Met/ Completed Goal: Hydration and nutrition: The patient will be hydrated and nourished. Outcome: Met/ Completed Goal: Collaboration and communication: The patient's status will be effectively communicated to the rest of the care team. Outcome: Met/ Completed   Problem: Fall Risk Prevention for Adult Populations Goal: High Risk Fall Prevention - Adults Description: The patient will remain free from falls Outcome: Met/ Completed   Problem: Skin Integrity Impairment for Adult populations Goal: Sensory Perception Description: All Adult patients at medium/high risk based on Braden risk assessment subscale will not develop a pressure injury. Implement prevention interventions based on risk assessment sub-scale scores: Outcome: Met/ Completed Goal: Moisture Description: All Adult patients at medium/high risk based on Braden risk assessment subscale will not develop a pressure injury. Implement prevention interventions based on risk assessment sub-scale scores: Outcome: Met/ Completed Goal: Activity Description: All Adult patients at medium/high risk based on Braden risk assessment subscale will not develop a pressure injury. Implement prevention interventions based on risk assessment sub-scale scores: Outcome: Met/ Completed Goal: Mobility Description: All Adult patients at medium/high risk based on Braden risk assessment subscale will not develop a pressure injury. Implement prevention interventions based on risk assessment sub-scale scores: Outcome: Met/  Completed Goal: Nutrition Description: All Adult patients at medium/high risk based on Braden risk assessment subscale will not develop a pressure injury. Implement prevention interventions based on risk assessment sub-scale scores: Outcome: Met/ Completed Goal: Friction & Shear Description: All Adult patients at medium/high risk based on Braden risk assessment subscale will not develop a pressure injury. Implement prevention interventions based on risk assessment sub-scale scores: Outcome: Met/ Completed   Problem: Intake: Goal: Nutrient Outcome: Met/ Completed

## 2024-06-30 NOTE — Discharge Summary (Addendum)
 Discharge to Outside Facility: Case Manager  Cassandra Glover, Cassandra Glover  Birth Date:  [id number] Age:  89 y.o. Sex:  female Gender:  female  Admit Date/Time:  06/23/2024 11:33 AM  Rationale For Transfer: Change in Level of Care  Planning For Transfer    Discharge Date:  06/30/2024    Discharge Disposition:  Skilled nursing facility    Method of Transport: ambulance     Receiving Facility:          [facility]        Person Accepting:          Madelin and Montie         Report called to Charge or care RN at:                   Accepting Physician:      Candyce Repress, MD         Referring Provider Pager #:       Provider Role Specialty Pager   * Edgar Marti Justice, MD Attending Internal Medicine * [phone]      Records Sent Demographic information Discharge to Outside Facility form Discharge summary Progress notes (may include op notes, procedure notes, therapy notes, consults) Medication administration record   Signed By: GOLDA HOPPING, MSW, ACM-SW  *Some images could not be shown.

## 2024-07-12 ENCOUNTER — Emergency Department

## 2024-07-12 ENCOUNTER — Inpatient Hospital Stay
Admission: EM | Admit: 2024-07-12 | Discharge: 2024-07-17 | DRG: 065 | Disposition: A | Discharge status: Skilled Nursing Facility | Attending: Internal Medicine | Admitting: Internal Medicine

## 2024-07-12 ENCOUNTER — Other Ambulatory Visit: Payer: Self-pay

## 2024-07-12 ENCOUNTER — Inpatient Hospital Stay

## 2024-07-12 DIAGNOSIS — Z681 Body mass index (BMI) 19 or less, adult: Secondary | ICD-10-CM

## 2024-07-12 DIAGNOSIS — E8809 Other disorders of plasma-protein metabolism, not elsewhere classified: Secondary | ICD-10-CM | POA: Diagnosis present

## 2024-07-12 DIAGNOSIS — D75839 Thrombocytosis, unspecified: Secondary | ICD-10-CM | POA: Diagnosis present

## 2024-07-12 DIAGNOSIS — Z7901 Long term (current) use of anticoagulants: Secondary | ICD-10-CM

## 2024-07-12 DIAGNOSIS — I5022 Chronic systolic (congestive) heart failure: Secondary | ICD-10-CM | POA: Diagnosis present

## 2024-07-12 DIAGNOSIS — R627 Adult failure to thrive: Secondary | ICD-10-CM | POA: Diagnosis present

## 2024-07-12 DIAGNOSIS — I4891 Unspecified atrial fibrillation: Secondary | ICD-10-CM | POA: Diagnosis not present

## 2024-07-12 DIAGNOSIS — R29810 Facial weakness: Secondary | ICD-10-CM | POA: Diagnosis present

## 2024-07-12 DIAGNOSIS — R1312 Dysphagia, oropharyngeal phase: Secondary | ICD-10-CM | POA: Diagnosis present

## 2024-07-12 DIAGNOSIS — Z7982 Long term (current) use of aspirin: Secondary | ICD-10-CM

## 2024-07-12 DIAGNOSIS — H409 Unspecified glaucoma: Secondary | ICD-10-CM | POA: Diagnosis present

## 2024-07-12 DIAGNOSIS — I10 Essential (primary) hypertension: Secondary | ICD-10-CM

## 2024-07-12 DIAGNOSIS — R569 Unspecified convulsions: Secondary | ICD-10-CM | POA: Diagnosis not present

## 2024-07-12 DIAGNOSIS — I482 Chronic atrial fibrillation, unspecified: Secondary | ICD-10-CM | POA: Diagnosis present

## 2024-07-12 DIAGNOSIS — G20A1 Parkinson's disease without dyskinesia, without mention of fluctuations: Secondary | ICD-10-CM | POA: Diagnosis present

## 2024-07-12 DIAGNOSIS — Z515 Encounter for palliative care: Secondary | ICD-10-CM

## 2024-07-12 DIAGNOSIS — G9349 Other encephalopathy: Secondary | ICD-10-CM | POA: Diagnosis present

## 2024-07-12 DIAGNOSIS — I63412 Cerebral infarction due to embolism of left middle cerebral artery: Secondary | ICD-10-CM | POA: Diagnosis not present

## 2024-07-12 DIAGNOSIS — Z7189 Other specified counseling: Secondary | ICD-10-CM | POA: Diagnosis not present

## 2024-07-12 DIAGNOSIS — Z79899 Other long term (current) drug therapy: Secondary | ICD-10-CM

## 2024-07-12 DIAGNOSIS — I6389 Other cerebral infarction: Secondary | ICD-10-CM | POA: Diagnosis present

## 2024-07-12 DIAGNOSIS — R29707 NIHSS score 7: Secondary | ICD-10-CM | POA: Diagnosis present

## 2024-07-12 DIAGNOSIS — Z85828 Personal history of other malignant neoplasm of skin: Secondary | ICD-10-CM

## 2024-07-12 DIAGNOSIS — R4701 Aphasia: Secondary | ICD-10-CM | POA: Diagnosis present

## 2024-07-12 DIAGNOSIS — Z882 Allergy status to sulfonamides status: Secondary | ICD-10-CM

## 2024-07-12 DIAGNOSIS — I639 Cerebral infarction, unspecified: Principal | ICD-10-CM | POA: Diagnosis present

## 2024-07-12 DIAGNOSIS — R54 Age-related physical debility: Secondary | ICD-10-CM | POA: Diagnosis present

## 2024-07-12 DIAGNOSIS — Z66 Do not resuscitate: Secondary | ICD-10-CM | POA: Diagnosis present

## 2024-07-12 DIAGNOSIS — R471 Dysarthria and anarthria: Secondary | ICD-10-CM | POA: Diagnosis present

## 2024-07-12 DIAGNOSIS — I6340 Cerebral infarction due to embolism of unspecified cerebral artery: Secondary | ICD-10-CM | POA: Diagnosis not present

## 2024-07-12 DIAGNOSIS — K219 Gastro-esophageal reflux disease without esophagitis: Secondary | ICD-10-CM | POA: Diagnosis present

## 2024-07-12 DIAGNOSIS — G8194 Hemiplegia, unspecified affecting left nondominant side: Secondary | ICD-10-CM | POA: Diagnosis present

## 2024-07-12 DIAGNOSIS — Z7983 Long term (current) use of bisphosphonates: Secondary | ICD-10-CM | POA: Diagnosis not present

## 2024-07-12 DIAGNOSIS — E039 Hypothyroidism, unspecified: Secondary | ICD-10-CM | POA: Diagnosis present

## 2024-07-12 DIAGNOSIS — E876 Hypokalemia: Secondary | ICD-10-CM | POA: Diagnosis present

## 2024-07-12 DIAGNOSIS — I110 Hypertensive heart disease with heart failure: Secondary | ICD-10-CM | POA: Diagnosis present

## 2024-07-12 DIAGNOSIS — E785 Hyperlipidemia, unspecified: Secondary | ICD-10-CM | POA: Diagnosis present

## 2024-07-12 DIAGNOSIS — Z8673 Personal history of transient ischemic attack (TIA), and cerebral infarction without residual deficits: Secondary | ICD-10-CM

## 2024-07-12 DIAGNOSIS — M810 Age-related osteoporosis without current pathological fracture: Secondary | ICD-10-CM | POA: Diagnosis present

## 2024-07-12 DIAGNOSIS — Z79890 Hormone replacement therapy (postmenopausal): Secondary | ICD-10-CM

## 2024-07-12 LAB — CBC
HCT: 36.9 % (ref 36.0–46.0)
Hemoglobin: 12.1 g/dL (ref 12.0–15.0)
MCH: 30.3 pg (ref 26.0–34.0)
MCHC: 32.8 g/dL (ref 30.0–36.0)
MCV: 92.5 fL (ref 80.0–100.0)
Platelets: 465 K/uL — ABNORMAL HIGH (ref 150–400)
RBC: 3.99 MIL/uL (ref 3.87–5.11)
RDW: 15.6 % — ABNORMAL HIGH (ref 11.5–15.5)
WBC: 10.7 K/uL — ABNORMAL HIGH (ref 4.0–10.5)
nRBC: 0 % (ref 0.0–0.2)

## 2024-07-12 LAB — DIFFERENTIAL
Abs Immature Granulocytes: 0.06 K/uL (ref 0.00–0.07)
Basophils Absolute: 0 K/uL (ref 0.0–0.1)
Basophils Relative: 0 %
Eosinophils Absolute: 0 K/uL (ref 0.0–0.5)
Eosinophils Relative: 0 %
Immature Granulocytes: 1 %
Lymphocytes Relative: 22 %
Lymphs Abs: 2.4 K/uL (ref 0.7–4.0)
Monocytes Absolute: 1.3 K/uL — ABNORMAL HIGH (ref 0.1–1.0)
Monocytes Relative: 12 %
Neutro Abs: 6.9 K/uL (ref 1.7–7.7)
Neutrophils Relative %: 65 %

## 2024-07-12 LAB — CBG MONITORING, ED: Glucose-Capillary: 113 mg/dL — ABNORMAL HIGH (ref 70–99)

## 2024-07-12 LAB — APTT: aPTT: 41 s — ABNORMAL HIGH (ref 24–36)

## 2024-07-12 LAB — COMPREHENSIVE METABOLIC PANEL WITH GFR
ALT: 15 U/L (ref 0–44)
AST: 24 U/L (ref 15–41)
Albumin: 3.4 g/dL — ABNORMAL LOW (ref 3.5–5.0)
Alkaline Phosphatase: 92 U/L (ref 38–126)
Anion gap: 11 (ref 5–15)
BUN: 12 mg/dL (ref 8–23)
CO2: 25 mmol/L (ref 22–32)
Calcium: 9.2 mg/dL (ref 8.9–10.3)
Chloride: 99 mmol/L (ref 98–111)
Creatinine, Ser: 0.61 mg/dL (ref 0.44–1.00)
GFR, Estimated: >60 mL/min
Glucose, Bld: 104 mg/dL — ABNORMAL HIGH (ref 70–99)
Potassium: 3.2 mmol/L — ABNORMAL LOW (ref 3.5–5.1)
Sodium: 135 mmol/L (ref 135–145)
Total Bilirubin: 1.2 mg/dL (ref 0.0–1.2)
Total Protein: 7.8 g/dL (ref 6.5–8.1)

## 2024-07-12 LAB — PROTIME-INR
INR: 1.1 (ref 0.8–1.2)
Prothrombin Time: 14.6 s (ref 11.4–15.2)

## 2024-07-12 LAB — ETHANOL: Alcohol, Ethyl (B): <15 mg/dL

## 2024-07-12 LAB — TROPONIN T, HIGH SENSITIVITY
Troponin T High Sensitivity: 34 ng/L — ABNORMAL HIGH (ref 0–19)
Troponin T High Sensitivity: 36 ng/L — ABNORMAL HIGH (ref 0–19)

## 2024-07-12 LAB — HEMOGLOBIN A1C
Hgb A1c MFr Bld: 5.9 % — ABNORMAL HIGH (ref 4.8–5.6)
Mean Plasma Glucose: 122.63 mg/dL

## 2024-07-12 LAB — AMMONIA: Ammonia: 34 umol/L (ref 9–35)

## 2024-07-12 LAB — MAGNESIUM: Magnesium: 1.9 mg/dL (ref 1.7–2.4)

## 2024-07-12 LAB — VITAMIN B12: Vitamin B-12: 418 pg/mL (ref 180–914)

## 2024-07-12 LAB — TSH: TSH: 1.89 u[IU]/mL (ref 0.350–4.500)

## 2024-07-12 MED ORDER — SENNOSIDES-DOCUSATE SODIUM 8.6-50 MG PO TABS: 1.0000 | ORAL_TABLET | Freq: Every evening | ORAL | Status: DC | PRN

## 2024-07-12 MED ORDER — POTASSIUM CHLORIDE 10 MEQ/100ML IV SOLN
10.0000 meq | INTRAVENOUS | Status: AC
  Administered 2024-07-12 (×5): 10 meq via INTRAVENOUS
  Filled 2024-07-12 (×5): qty 100

## 2024-07-12 MED ORDER — DILTIAZEM HCL 25 MG/5ML IV SOLN
5.0000 mg | Freq: Once | INTRAVENOUS | Status: AC
  Administered 2024-07-12: 5 mg via INTRAVENOUS
  Filled 2024-07-12: qty 5

## 2024-07-12 MED ORDER — BISACODYL 10 MG RE SUPP
10.0000 mg | Freq: Once | RECTAL | Status: AC
  Administered 2024-07-12: 10 mg via RECTAL
  Filled 2024-07-12: qty 1

## 2024-07-12 MED ORDER — LACTATED RINGERS IV SOLN: INTRAVENOUS | Status: AC

## 2024-07-12 MED ORDER — IOHEXOL 350 MG/ML SOLN
75.0000 mL | Freq: Once | INTRAVENOUS | Status: AC | PRN
  Administered 2024-07-12: 75 mL via INTRAVENOUS

## 2024-07-12 MED ORDER — DILTIAZEM HCL-DEXTROSE 125-5 MG/125ML-% IV SOLN (PREMIX)
5.0000 mg/h | INTRAVENOUS | Status: DC
  Administered 2024-07-12: 5 mg/h via INTRAVENOUS
  Administered 2024-07-12: 7.5 mg/h via INTRAVENOUS
  Administered 2024-07-13 (×2): 15 mg/h via INTRAVENOUS
  Filled 2024-07-12 (×4): qty 125

## 2024-07-12 MED ORDER — DILTIAZEM HCL 25 MG/5ML IV SOLN
10.0000 mg | Freq: Once | INTRAVENOUS | Status: AC
  Administered 2024-07-12: 10 mg via INTRAVENOUS
  Filled 2024-07-12: qty 5

## 2024-07-12 MED ORDER — ACETAMINOPHEN 325 MG PO TABS
650.0000 mg | ORAL_TABLET | Freq: Four times a day (QID) | ORAL | Status: DC | PRN
  Administered 2024-07-13 – 2024-07-16 (×3): 650 mg via ORAL
  Filled 2024-07-12 (×3): qty 2

## 2024-07-12 MED ORDER — ONDANSETRON HCL 4 MG PO TABS: 4.0000 mg | ORAL_TABLET | Freq: Four times a day (QID) | ORAL | Status: DC | PRN

## 2024-07-12 MED ORDER — MAGNESIUM SULFATE 2 GM/50ML IV SOLN
2.0000 g | Freq: Once | INTRAVENOUS | Status: AC
  Administered 2024-07-12: 2 g via INTRAVENOUS
  Filled 2024-07-12: qty 50

## 2024-07-12 MED ORDER — STROKE: EARLY STAGES OF RECOVERY BOOK: Freq: Once | Status: DC

## 2024-07-12 MED ORDER — SODIUM CHLORIDE 0.9% FLUSH
3.0000 mL | Freq: Two times a day (BID) | INTRAVENOUS | Status: DC
  Administered 2024-07-12 – 2024-07-17 (×10): 3 mL via INTRAVENOUS

## 2024-07-12 MED ORDER — ONDANSETRON HCL 4 MG/2ML IJ SOLN: 4.0000 mg | Freq: Four times a day (QID) | INTRAMUSCULAR | Status: DC | PRN

## 2024-07-12 MED ORDER — ACETAMINOPHEN 650 MG RE SUPP: 650.0000 mg | Freq: Four times a day (QID) | RECTAL | Status: DC | PRN

## 2024-07-12 MED ORDER — SODIUM CHLORIDE 0.9% FLUSH
3.0000 mL | Freq: Once | INTRAVENOUS | Status: AC
  Administered 2024-07-12: 3 mL via INTRAVENOUS

## 2024-07-12 MED ORDER — HYDROMORPHONE HCL 1 MG/ML IJ SOLN
0.2500 mg | Freq: Once | INTRAMUSCULAR | Status: AC
  Administered 2024-07-12: 0.25 mg via INTRAVENOUS
  Filled 2024-07-12: qty 0.5

## 2024-07-12 NOTE — ED Notes (Signed)
 Patient was transferred to MRI.

## 2024-07-12 NOTE — Consult Note (Signed)
 WOC Nurse Consult Note: Reason for Consult: back ulcer  Wound type: pressure injury Unstageable Pressure Injury Pressure Injury POA: Yes Measurement: see nursing flow sheet Wound bed: 100 % yellow distally Drainage (amount, consistency, odor) see nursing flow sheets Periwound:intact  Dressing procedure/placement/frequency: Cleanse with Vashe Soila [phone]), pat dry Apply xeroform and foam. Change daily   Re consult if needed, will not follow at this time. Thanks  S[employer]i [employer], RN,CWOCN, CNS, [employer] [phone]

## 2024-07-12 NOTE — ED Notes (Signed)
 Messaged to update Matthews, MD per recommendations gave. Alerted MD that patient is still presenting with same symptoms and has an increase with minimal mumbling from previous status in CT. MD also notified that MRI had not been ordered yet per recommendations.

## 2024-07-12 NOTE — Progress Notes (Addendum)
 SLP Cancellation Note  Patient Details Name: KYNSLEE BAHAM MRN: [id number] DOB: 1931-12-27   Cancelled treatment:       Reason Eval/Treat Not Completed: Patient not medically ready (chart reviewed)   Per chart notes, patient cannot verbally answer questions, patient was able to follow commands; she has mumbled speech.  MRI today: Restricted diffusion within the cortex of the left precentral gyrus compatible with acute non hemorrhagic infarction.  2. Age-related atrophy and mild-to-moderate cerebral white matter disease. 3. Chronic lacunar infarcts within the internal capsules bilaterally. Noted pt is NPO post Failing the bedside swallow screen w/ NSG. Recommend frequent oral care while NPO. BSE order in place.  In setting of pt's decreased alertness at this time, will HOLD on BSE/cognitive assessment until tomorrow when pt may be more alert and able to engage in assessment. Pt has only been admitted for 4 hours per chart.  NSG updated.       Comer Portugal, MS, CCC-SLP Speech Language Pathologist Rehab Services; [facility] [phone] (ascom) Mose Colaizzi 07/12/2024, 12:45 PM

## 2024-07-12 NOTE — ED Provider Notes (Signed)
 "  [facility] Provider Note    Event Date/Time   First MD Initiated Contact with Patient 07/12/24 [phone]     (approximate)   History   Code Stroke   HPI  Cassandra Glover is a 89 y.o. female with history of Parkinson's, A-fib, HFrEF, hyperlipidemia, hypothyroidism, osteoporosis, and rectal prolapse who presents with acute onset of weakness and aphasia.  Per EMS, the patient was last known well at 6 AM when her facility staff rounded on her.  Subsequently they checked on her at 8 AM and she was unable to speak.  They apparently noted right facial droop and left arm weakness.  The patient is able to speak somewhat now although not able to give much history.  I reviewed the past medical records.  The patient was admitted at [facility] from 12/30 through 1/6 with diarrhea and a CHF exacerbation.   Physical Exam   Triage Vital Signs: ED Triage Vitals  Encounter Vitals Group     BP      Girls Systolic BP Percentile      Girls Diastolic BP Percentile      Boys Systolic BP Percentile      Boys Diastolic BP Percentile      Pulse      Resp      Temp      Temp src      SpO2      Weight      Height      Head Circumference      [facility] Flow      Pain Score      Pain Loc      Pain Education      Exclude from Growth Chart     Most recent vital signs: Vitals:   07/12/24 1336 07/12/24 1400  BP:  107/62  Pulse:    Resp:    Temp: 98.4 F (36.9 C)   SpO2:  96%     General: Awake, weak appearing, no distress.  CV:  Good peripheral perfusion.  Resp:  Normal effort.  Abd:  No distention.  Other:  5/5 motor strength to RUE.  Left arm weakness and tremor.  Expressive aphasia.   ED Results / Procedures / Treatments   Labs (all labs ordered are listed, but only abnormal results are displayed) Labs Reviewed  APTT - Abnormal; Notable for the following components:      Result Value   aPTT 41 (*)    All other components within normal limits  CBC - Abnormal;  Notable for the following components:   WBC 10.7 (*)    RDW 15.6 (*)    Platelets 465 (*)    All other components within normal limits  DIFFERENTIAL - Abnormal; Notable for the following components:   Monocytes Absolute 1.3 (*)    All other components within normal limits  COMPREHENSIVE METABOLIC PANEL WITH GFR - Abnormal; Notable for the following components:   Potassium 3.2 (*)    Glucose, Bld 104 (*)    Albumin 3.4 (*)    All other components within normal limits  CBG MONITORING, ED - Abnormal; Notable for the following components:   Glucose-Capillary 113 (*)    All other components within normal limits  TROPONIN T, HIGH SENSITIVITY - Abnormal; Notable for the following components:   Troponin T High Sensitivity 34 (*)    All other components within normal limits  TROPONIN T, HIGH SENSITIVITY - Abnormal; Notable for the following components:  Troponin T High Sensitivity 36 (*)    All other components within normal limits  PROTIME-INR  ETHANOL  MAGNESIUM   HEMOGLOBIN A1C  TSH  SYPHILIS: RPR W/REFLEX TO RPR TITER AND TREPONEMAL ANTIBODIES, TRADITIONAL SCREENING AND DIAGNOSIS ALGORITHM  AMMONIA  VITAMIN B12  HIV ANTIBODY (ROUTINE TESTING W REFLEX)  I-STAT CREATININE, ED  CBG MONITORING, ED     EKG  ED ECG REPORT I, Waylon Cassis, the attending physician, personally viewed and interpreted this ECG.  Date: 07/12/2024 EKG Time: 0914 Rate: 152 Rhythm: Atrial fibrillation with RVR QRS Axis: Right axis Intervals: normal ST/T Wave abnormalities: Nonspecific T wave abnormality Narrative Interpretation: no evidence of acute ischemia    RADIOLOGY  CT head: I independently viewed and interpreted the images; there is no ICH.  Radiology report indicates no acute intracranial findings  CT angio head/neck:   IMPRESSION:  1. No large vessel occlusion, hemodynamically significant stenosis, or aneurysm  in the head or neck.  2. Mild calcific atheromatous disease within  the carotid bulbs and carotid  siphons without flow-limiting stenosis.  3. Tortuous common carotid and internal carotid arteries, with the right common  carotid artery having a retropharyngeal bend displacing the esophagus to the  left.  4. Findings communicated to Dr. Matthews at 9:19 AM on 07/12/2024.   MR brain:  IMPRESSION:  1. Restricted diffusion within the cortex of the left precentral gyrus  compatible with acute non hemorrhagic infarction.  2. Age-related atrophy and mild-to-moderate cerebral white matter disease.  3. Chronic lacunar infarcts within the internal capsules bilaterally.  4. Moderate paranasal sinus mucosal disease.    PROCEDURES:  Critical Care performed: Yes, see critical care procedure note(s)  .Critical Care  Performed by: Cassis Waylon, MD Authorized by: Cassis Waylon, MD   Critical care provider statement:    Critical care time (minutes):  30   Critical care time was exclusive of:  Separately billable procedures and treating other patients   Critical care was necessary to treat or prevent imminent or life-threatening deterioration of the following conditions:  CNS failure or compromise   Critical care was time spent personally by me on the following activities:  Development of treatment plan with patient or surrogate, discussions with consultants, evaluation of patient's response to treatment, examination of patient, ordering and review of laboratory studies, ordering and review of radiographic studies, ordering and performing treatments and interventions, pulse oximetry, re-evaluation of patient's condition and review of old charts   Care discussed with: admitting provider      MEDICATIONS ORDERED IN ED: Medications  diltiazem  (CARDIZEM ) 125 mg in dextrose  5% 125 mL (1 mg/mL) infusion (5 mg/hr Intravenous New Bag/Given 07/12/24 1200)  sodium chloride  flush (NS) 0.9 % injection 3 mL (3 mLs Intravenous Given 07/12/24 1242)  acetaminophen   (TYLENOL ) tablet 650 mg (has no administration in time range)    Or  acetaminophen  (TYLENOL ) suppository 650 mg (has no administration in time range)  senna-docusate (Senokot-S) tablet 1 tablet (has no administration in time range)  lactated ringers  infusion ( Intravenous New Bag/Given 07/12/24 1250)  ondansetron  (ZOFRAN ) tablet 4 mg (has no administration in time range)    Or  ondansetron  (ZOFRAN ) injection 4 mg (has no administration in time range)   stroke: early stages of recovery book (has no administration in time range)  HYDROmorphone  (DILAUDID ) injection 0.25 mg (has no administration in time range)  sodium chloride  flush (NS) 0.9 % injection 3 mL (3 mLs Intravenous Given 07/12/24 0920)  iohexol  (OMNIPAQUE ) 350 MG/ML injection  75 mL (75 mLs Intravenous Contrast Given 07/12/24 0902)  diltiazem  (CARDIZEM ) injection 5 mg (5 mg Intravenous Given 07/12/24 0929)  diltiazem  (CARDIZEM ) injection 10 mg (10 mg Intravenous Given 07/12/24 1100)     IMPRESSION / MDM / ASSESSMENT AND PLAN / ED COURSE  I reviewed the triage vital signs and the nursing notes.  89 year old female with PMH as noted above presents with acute onset of aphasia and left sided weakness, with the last known well at 6 AM.  Differential diagnosis includes, but is not limited to, CVA, TIA, complex migraine, seizure.  Code stroke was activated in the field.  I consulted and discussed the case with Dr. Matthews from neurology.  Will obtain CT, lab workup, and reassess.  Patient's presentation is most consistent with acute presentation with potential threat to life or bodily function.  The patient is on the cardiac monitor to evaluate for evidence of arrhythmia and/or significant heart rate changes.  ----------------------------------------- 12:46 PM on 07/12/2024 -----------------------------------------  CT head is negative.  CTA shows no LVO.  MRI shows evidence of an acute stroke.  The patient only has tremor when moving the  left arm.  There is no evidence of seizure activity.  Her heart rate responded well to the initial 2 doses of IV Cardizem , but then went back up to the 150s.  Therefore I started her on a Cardizem  infusion.  Lab workup is overall unremarkable.  CMP shows no acute findings.  CBC is normal.  Troponin is minimally elevated.  I consulted Dr. Fernand from the hospitalist service; based on our discussion he agrees to evaluate the patient for admission.  FINAL CLINICAL IMPRESSION(S) / ED DIAGNOSES   Final diagnoses:  Acute CVA (cerebrovascular accident) (HCC)     Rx / DC Orders   ED Discharge Orders     None        Note:  This document was prepared using Dragon voice recognition software and may include unintentional dictation errors.    Jacolyn Pae, MD 07/12/24 1521  "

## 2024-07-12 NOTE — ED Notes (Signed)
 Patient was informed of treatment decision at this time. No administration of TNK at this time due to patient being on blood thinners at home. Plan from neurologist is to scan patient in MRI and message her in about 30 minutes with an update on patients status.

## 2024-07-12 NOTE — Consult Note (Signed)
 NEUROLOGY CONSULT NOTE   Date of service: July 12, 2024 Patient Name: Cassandra Glover MRN:  [id number] DOB:  04-Apr-1932 Chief Complaint: stroke code Requesting Provider: Fernand Prost, MD  History of Present Illness  Cassandra Glover is a 89 y.o. female with hx of HTN, HL, a fib on therapeutic lovenox , CHF hypothyroidism presenting to ED with L facial droop and LUE and LLE weakness. LKW 0600 today. EMS reported that in the field she was unable to speak. She is able to talk some now but per family this is new. NIHSS = 7. Head CT personal review showed no acute process.  Unfortunately she was not a candidate for TNK 2/2 being on anticoagulation at facility (therapeutic lovenox  per facility [facility]). Exam not consistent with LVO therefore CTA H&N was not performed during the stroke code.  Initially I was concerned about her tremor but then found out she has established dx of Parkinsons on sinemet . 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 So her tremor is favored to be chronic. We will work up AMS but will likely defer any medication changes related to Parkinsons to the outpatient setting.  LKW: 0600 Modified rankin score: 2-Slight disability-UNABLE to perform all activities but does not need assistance   NIHSS components Score: Comment  1a Level of Conscious 0[x]  1[]  2[]  3[]      1b LOC Questions 0[]  1[x]  2[]       1c LOC Commands 0[x]  1[]  2[]       2 Best Gaze 0[x]  1[]  2[]       3 Visual 0[x]  1[]  2[]  3[]      4 Facial Palsy 0[]  1[x]  2[]  3[]      5a Motor Arm - left 0[]  1[x]  2[]  3[]  4[]  UN[]    5b Motor Arm - Right 0[x]  1[]  2[]  3[]  4[]  UN[]    6a Motor Leg - Left 0[]  1[]  2[x]  3[]  4[]  UN[]    6b Motor Leg - Right 0[]  1[]  2[x]  3[]  4[]  UN[]    7 Limb Ataxia 0[x]  1[]  2[]  UN[]      8 Sensory 0[x]  1[]  2[]  UN[]      9 Best Language 0[]  1[x]  2[]  3[]      10 Dysarthria 0[]  1[x]  2[]  UN[]      11 Extinct. and Inattention 0[x]  1[]  2[]       TOTAL:  7     ROS  Unable to ascertain due to  AMS  Past History   Past Medical History:  Diagnosis Date   Actinic keratosis    Basal cell carcinoma 08/04/2019   left superior helix. Excised 09/01/2019, margins free.   Basal cell carcinoma 08/15/2017   R shoulder posterior    Basal cell carcinoma 01/06/2015   Left distal lateral pretibial,  left medial ankle, right proximal lateral pretibial,   Basal cell carcinoma 11/29/2014   L lateral inferior knee   Basal cell carcinoma 08/26/2014   anterior chin   Basal cell carcinoma 02/25/2014   left forearm and left temple hairline   Basal cell carcinoma 02/03/2013    left post auricular auricular inferior inferior neck and ) right paraspinal mid to upper back / excised    Basal cell carcinoma 09/21/2008   L pretibial 0.8cm  and LEFT MEDIAL SUP CALF, 1.1 X 0.6CM   Basal cell carcinoma 10/04/2009   R distal pretibial    History of basal cell carcinoma (BCC) 07/15/2019   left superior helix and  right dorsum proximal forearm   History of basal cell carcinoma (BCC) 09/23/2007   R SUP  MEDIAL CALF ANT and R MID LAT PRETIBIAL   History of basal cell carcinoma (BCC) 05/04/2008   R TRICEP    History reviewed. No pertinent surgical history.  Family History: History reviewed. No pertinent family history.  Social History  has no history on file for tobacco use, alcohol use, and drug use.  Allergies[1]  Medications  Current Medications[2]  Vitals   Vitals:   08-06-2024 1000 Aug 06, 2024 1030 2024/08/06 1200 2024-08-06 1230  BP: 112/78 128/80 114/82 110/66  Pulse: (!) 117 (!) 139 (!) 126 (!) 118  Resp: 17 (!) 28 (!) 26 (!) 24  Temp:      TempSrc:      SpO2: 93% 94% 93% 94%  Weight:      Height:        Body mass index is 18.78 kg/m.   Physical Exam   Gen: patient lying in bed, NAD CV: extremities appear well-perfused Resp: normal WOB  Neurologic exam MS: alert, oriented to self and age but not month Speech: mild dysarthria, mild aphasia CN: PERRL, VFF, EOMI, sensation intact,  mild R NLF flattening, hearing intact to voice Motor: RUE no drift, LUE drift but not to bed, BLE minimal movement against gravity Sensory: SILT Reflexes: 1+ symm with toes down bilat Coordination: UTA 2/2 difficulty following complex commands Gait: deferred  Labs/Imaging/Neurodiagnostic studies   CBC:  Recent Labs  Lab Aug 06, 2024 0848  WBC 10.7*  NEUTROABS 6.9  HGB 12.1  HCT 36.9  MCV 92.5  PLT 465*   Basic Metabolic Panel:  Lab Results  Component Value Date   NA 135 08/06/24   K 3.2 (L) 2024-08-06   CO2 25 08/06/2024   GLUCOSE 104 (H) 2024/08/06   BUN 12 08/06/2024   CREATININE 0.61 06-Aug-2024   CALCIUM 9.2 August 06, 2024   GFRNONAA >60 08/06/2024   GFRAA >60 07/31/2011   Lipid Panel: No results found for: LDLCALC HgbA1c: No results found for: HGBA1C Urine Drug Screen: No results found for: LABOPIA, COCAINSCRNUR, LABBENZ, AMPHETMU, THCU, LABBARB  Alcohol Level     Component Value Date/Time   ETH <15 2024-08-06 0848   INR  Lab Results  Component Value Date   INR 1.1 08-06-24   APTT  Lab Results  Component Value Date   APTT 41 (H) 08/06/24   AED levels: No results found for: PHENYTOIN, ZONISAMIDE, LAMOTRIGINE, LEVETIRACETA  CT Head without contrast(Personally reviewed): 1. No acute intracranial abnormality to suggest acute infarct or hemorrhage in the setting of suspected acute stroke. 2. Age-related volume loss and chronic lacunar infarcts within the internal capsules bilaterally. 3. Moderate paranasal sinus disease with air-fluid levels in the right maxillary and left sphenoid sinuses, which can be seen with acute sinusitis. 4. These findings were communicated to Dr. Matthews at 08:58 AM on 08/06/2024. ASPECTS: 10.  CT angio Head and Neck with contrast(Personally reviewed):  No large vessel occlusion, hemodynamically significant stenosis, or aneurysm in the head or neck. 2. Mild calcific atheromatous disease within the carotid  bulbs and carotid siphons without flow-limiting stenosis. 3. Tortuous common carotid and internal carotid arteries, with the right common carotid artery having a retropharyngeal bend displacing the esophagus to the left.  MRI Brain(Personally reviewed) - performed after the stroke code 1. Restricted diffusion within the cortex of the left precentral gyrus compatible with acute non hemorrhagic infarction. 2. Age-related atrophy and mild-to-moderate cerebral white matter disease. 3. Chronic lacunar infarcts within the internal capsules bilaterally. 4. Moderate paranasal sinus mucosal disease.  Stroke Labs  No results  found for: CHOL, TRIG, HDL, CHOLHDL, VLDL, LDLCALC, LDLDIRECT, LABVLDL  No results found for: HGBA1C    ASSESSMENT   Cassandra Glover is a 89 y.o. female with hx of HTN, HL, a fib on therapeutic lovenox , CHF hypothyroidism presenting to ED with L facial droop and LUE and LLE weakness. LKW 0600 today. EMS reported that in the field she was unable to speak. She is able to talk some now but per family this is new.   MRI brain performed after the stroke code confirmed an acute ischemic infarct in the cortex of the left precentral gyrus. Also noted are chronic infarcts in the internal capsules bilaterally.  She is somewhat confused and has resting tremor in R hand (2/2 known dx of Parkinson's). We will work up AMS but will defer any medication changes related to Parkinsons to the outpatient setting.   RECOMMENDATIONS   # Acute ischemic stroke - Permissive HTN x48 hrs from sx onset goal BP <220/110. PRN labetalol or hydralazine if BP above these parameters. Avoid oral antihypertensives. - F/u TTE (order placed) - Check A1c and LDL + add statin per guidelines - Hold lovenox  in the setting of acute ischemic stroke. Consider switching to heparin gtt at hospital day #3 if exam is stable or improved. In the meantime will give 1 ASA 81mg  daily. Please stop  aspirin  when heparin is started (unless there is an indication outside of ischemic stroke to continue it) - q4 hr neuro checks - STAT head CT for any change in neuro exam - Tele - PT/OT/SLP - Stroke education - Amb referral to neurology upon discharge   # AMS - Routine EEG in AM (not available on weekends) - B12, ammonia, TSH, RPR, UA  Delirium precautions - Minimize/avoid deliriogenic meds including: anticholinergic, opiates, benzodiazepines           - Maintain hydration, oxygenation, nutrition           - Limit use of restraints and catheters           - Normalize sleep patterns by minimizing nighttime noise, light and interruptions by                -Ensure sleep apnea treatment is provided overnight.             clustering care, opening blinds during the day           - Reorient the patient frequently, provide easily visible clock and calendar           - Provide sensory aids like glasses, hearing aids           - Encourage ambulation, regular activities and visitors to maintain cognitive stimulation              -Patient would benefit from having family members at bedside to reinforce his orientation.;li  Parkinson's disease with resting tremor - Continue home sinemet   Essential (action) tremor - Continue primidone   Will continue to follow ______________________________________________________________________    Signed, Elida CHRISTELLA Ross, MD [facility]     [1]  Allergies Allergen Reactions   Sulfa Antibiotics Dermatitis and Hives    Hives  Hives  Hives  sulfamethoxazole   Simvastatin Other (See Comments)    Joint pains   Sulfamethoxazole Rash    Hives  [2]  Current Facility-Administered Medications:    [START ON 07/13/2024]  stroke: early stages of recovery book, , Does not apply, Once, Fernand Prost, MD   acetaminophen  (TYLENOL ) tablet 650  mg, 650 mg, Oral, Q6H PRN **OR** acetaminophen  (TYLENOL ) suppository 650 mg, 650 mg, Rectal, Q6H PRN, Fernand Prost, MD   diltiazem  (CARDIZEM ) 125 mg in dextrose  5% 125 mL (1 mg/mL) infusion, 5-15 mg/hr, Intravenous, Continuous, Siadecki, Waylon, MD, Last Rate: 5 mL/hr at 07/12/24 1200, 5 mg/hr at 07/12/24 1200   lactated ringers  infusion, , Intravenous, Continuous, Fernand Prost, MD, Last Rate: 40 mL/hr at 07/12/24 1250, New Bag at 07/12/24 1250   ondansetron  (ZOFRAN ) tablet 4 mg, 4 mg, Oral, Q6H PRN **OR** ondansetron  (ZOFRAN ) injection 4 mg, 4 mg, Intravenous, Q6H PRN, Fernand Prost, MD   senna-docusate (Senokot-S) tablet 1 tablet, 1 tablet, Oral, QHS PRN, Fernand Prost, MD   sodium chloride  flush (NS) 0.9 % injection 3 mL, 3 mL, Intravenous, Q12H, Fernand Prost, MD, 3 mL at 07/12/24 1242  Current Outpatient Medications:    acetaminophen  (TYLENOL ) 500 MG tablet, Take 1,000 mg by mouth every 8 (eight) hours as needed., Disp: , Rfl:    atorvastatin (LIPITOR) 20 MG tablet, Take by mouth., Disp: , Rfl:    brimonidine  (ALPHAGAN ) 0.2 % ophthalmic solution, Place 1 drop into the right eye 2 (two) times daily., Disp: , Rfl:    Calcium-Vitamin D 500-125 MG-UNIT TABS, Take 1 tablet by mouth 2 (two) times daily., Disp: , Rfl:    carbidopa -levodopa  (SINEMET  IR) 25-100 MG tablet, Take 2 tablets by mouth 3 (three) times daily., Disp: , Rfl:    cetirizine  (ZYRTEC ) 10 MG tablet, Take 10 mg by mouth daily., Disp: , Rfl:    Cholecalciferol 50 MCG (2000 UT) CAPS, Take 1 capsule by mouth daily., Disp: , Rfl:    diltiazem  (CARDIZEM  CD) 180 MG 24 hr capsule, Take 180 mg by mouth at bedtime., Disp: , Rfl:    dorzolamide -timolol  (COSOPT ) 2-0.5 % ophthalmic solution, Place 1 drop into both eyes 2 (two) times daily., Disp: , Rfl:    enoxaparin  (LOVENOX ) 60 MG/0.6ML injection, Inject 50 mg into the skin daily., Disp: , Rfl:    guaiFENesin  (MUCINEX ) 600 MG 12 hr tablet, Take 600 mg by mouth 2 (two) times daily., Disp: , Rfl:    latanoprost  (XALATAN ) 0.005 % ophthalmic solution, Place 1 drop into both eyes at bedtime., Disp: ,  Rfl:    levothyroxine  (SYNTHROID ) 25 MCG tablet, Take 25 mcg by mouth daily. On Tuesday and Thursday, Disp: , Rfl:    levothyroxine  (SYNTHROID ) 25 MCG tablet, Take 2 tablets by mouth daily. On Sun, Mon, Wed, Fri, and Sat, Disp: , Rfl:    Multiple Vitamins-Minerals (PRESERVISION AREDS 2) CAPS, Take 1 capsule by mouth 2 (two) times daily with a meal., Disp: , Rfl:    pramipexole  (MIRAPEX ) 0.5 MG tablet, Take 0.5 mg by mouth at bedtime., Disp: , Rfl:    prednisoLONE  acetate (PRED FORTE ) 1 % ophthalmic suspension, Place 1 drop into both eyes daily., Disp: , Rfl:    primidone  (MYSOLINE ) 50 MG tablet, Take 50 mg by mouth 2 (two) times daily., Disp: , Rfl:    senna-docusate (SENOKOT-S) 8.6-50 MG tablet, Take 2 tablets by mouth 2 (two) times daily as needed., Disp: , Rfl:    mupirocin  ointment (BACTROBAN ) 2 %, Apply to skin qd-bid (Patient not taking: Reported on 07/12/2024), Disp: 22 g, Rfl: 1

## 2024-07-12 NOTE — ED Triage Notes (Addendum)
 Patient was last checked on at [facility] at 0600 this morning. When staff came back around to check on her at 0800 they noticed Right sided facial drop and Left sided weakness. Normally she is able to speak and AOX4, at this moment patient cannot verbally answer questions, patient was able to follow commands. Normally she is able to walk with assistance. Left arm is normally contracted as well. But patient is unable to make it stop shaking. Patient takes enoxaparin , last dose 01/17 @2054PM . Husband states that patient's behavior has become increasingly more rambled the past 2 weeks. He also states the patient has not been acting like themselves the past two weeks. Husband and son at bedside.   CBG: 113 LKW: 0600 07/13/2023 BP: 136/96 (109) HT: 5'3 Wt: 48.1kg Blood Thinners: On Enoxaparin , last dose administered at 2054 07/13/2023

## 2024-07-12 NOTE — H&P (Signed)
 " History and Physical    MISCHELL Glover FMW:[id number] DOB: July 15, 1931 DOA: 07/12/2024  DOS: the patient was seen and examined on 07/12/2024  PCP: Jyl Railing, MD   Patient coming from: [facility]  I have personally briefly reviewed patient's old medical records in [facility] and CareEverywhere  HPI:   Cassandra Glover is a 89 y.o. year old female with medical history of hypertension, hyperlipidemia, atrial fibrillation, CHF, hypothyroidism presenting to the ED with facial droop and left-sided weakness. LKW was 6 am per facility. On arrival to the ED patient was noted to be HDS stable.  Lab work and imaging obtained.  CBC with mild leukocytosis and mild thrombocytosis, normal hemoglobin.  CMP with mild hypokalemia at 3.2 and mild hypoalbuminemia.  Troponin mildly elevated but flat.  Code stroke initiated on patient arrival CT head without any acute findings.  CTA with no large vessel occlusion.  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  Patient also noted to be in A-fib with RVR and multiple rounds of diltiazem  given with decrease in heart rate but RVR persistent so diltiazem  gtt. started.  Given need for further care, [facility] contacted for admission.  Review of Systems: As mentioned in the history of present illness. All other systems reviewed and are negative.   Past Medical History:  Diagnosis Date   Actinic keratosis    Basal cell carcinoma 08/04/2019   left superior helix. Excised 09/01/2019, margins free.   Basal cell carcinoma 08/15/2017   R shoulder posterior    Basal cell carcinoma 01/06/2015   Left distal lateral pretibial,  left medial ankle, right proximal lateral pretibial,   Basal cell carcinoma 11/29/2014   L lateral inferior knee   Basal cell carcinoma 08/26/2014   anterior chin   Basal cell carcinoma 02/25/2014   left forearm and left temple hairline   Basal cell carcinoma 02/03/2013    left post auricular  auricular inferior inferior neck and ) right paraspinal mid to upper back / excised    Basal cell carcinoma 09/21/2008   L pretibial 0.8cm  and LEFT MEDIAL SUP CALF, 1.1 X 0.6CM   Basal cell carcinoma 10/04/2009   R distal pretibial    History of basal cell carcinoma (BCC) 07/15/2019   left superior helix and  right dorsum proximal forearm   History of basal cell carcinoma (BCC) 09/23/2007   R SUP MEDIAL CALF ANT and R MID LAT PRETIBIAL   History of basal cell carcinoma (BCC) 05/04/2008   R TRICEP    History reviewed. No pertinent surgical history.   Allergies[1]  History reviewed. No pertinent family history.  Prior to Admission medications  Medication Sig Start Date End Date Taking? Authorizing Provider  acetaminophen  (TYLENOL ) 500 MG tablet Take 1,000 mg by mouth every 8 (eight) hours as needed.   Yes [provider]  atorvastatin (LIPITOR) 20 MG tablet Take by mouth. 06/27/11 07/12/24 Yes [provider]  brimonidine  (ALPHAGAN ) 0.2 % ophthalmic solution Place 1 drop into the right eye 2 (two) times daily. 02/11/24 02/10/25 Yes [provider]  Calcium-Vitamin D 500-125 MG-UNIT TABS Take 1 tablet by mouth 2 (two) times daily.   Yes [provider]  carbidopa -levodopa  (SINEMET  IR) 25-100 MG tablet Take 2 tablets by mouth 3 (three) times daily. 05/24/19 07/12/24 Yes [provider]  cetirizine  (ZYRTEC ) 10 MG tablet Take 10 mg by mouth daily.   Yes [provider]  diltiazem  (CARDIZEM  CD) 180  MG 24 hr capsule Take 180 mg by mouth at bedtime. 06/30/24 06/30/25 Yes [provider]  dorzolamide -timolol  (COSOPT ) 2-0.5 % ophthalmic solution Place 1 drop into both eyes 2 (two) times daily. 01/14/24 01/13/25 Yes [provider]  enoxaparin  (LOVENOX ) 60 MG/0.6ML injection Inject 50 mg into the skin daily. 07/01/24 07/31/24 Yes [provider]  guaiFENesin  (MUCINEX ) 600 MG 12 hr tablet Take 600 mg by mouth 2 (two) times daily.  07/06/24 07/20/24 Yes [provider]  latanoprost  (XALATAN ) 0.005 % ophthalmic solution Place 1 drop into both eyes at bedtime. 01/14/24 01/13/25 Yes [provider]  levothyroxine  (SYNTHROID ) 25 MCG tablet Take 25 mcg by mouth daily. On Tuesday and Thursday 09/21/19 07/12/24 Yes [provider]  levothyroxine  (SYNTHROID ) 25 MCG tablet Take 2 tablets by mouth daily. On Sun, Mon, Wed, Fri, and Sat 09/21/19  Yes [provider]  Multiple Vitamins-Minerals (PRESERVISION AREDS 2) CAPS Take 1 capsule by mouth 2 (two) times daily with a meal.   Yes [provider]  pramipexole  (MIRAPEX ) 0.5 MG tablet Take 0.5 mg by mouth at bedtime. 03/27/24  Yes [provider]  prednisoLONE  acetate (PRED FORTE ) 1 % ophthalmic suspension Place 1 drop into both eyes daily. 01/10/21  Yes [provider]  primidone  (MYSOLINE ) 50 MG tablet Take 50 mg by mouth 2 (two) times daily. 06/18/11 07/12/24 Yes [provider]  senna-docusate (SENOKOT-S) 8.6-50 MG tablet Take 2 tablets by mouth 2 (two) times daily as needed. 06/30/24  Yes [provider]  alendronate (FOSAMAX) 70 MG tablet  06/14/11   [provider]  apixaban (ELIQUIS) 2.5 MG TABS tablet Take by mouth. Patient not taking: Reported on 07/12/2024 08/12/19   [provider]  cefixime (SUPRAX) 400 MG CAPS capsule Take by mouth. 05/31/11   [provider]  Cholecalciferol 50 MCG (2000 UT) CAPS Take by mouth.    [provider]  dofetilide ESAU) 125 MCG capsule Take by mouth. 05/09/11   [provider]  Loteprednol Etabonate 0.5 % GEL Apply to eye. 07/21/12   [provider]  metoprolol tartrate (LOPRESSOR) 25 MG tablet  06/18/11   [provider]  metroNIDAZOLE (METROGEL) 1 % gel APPLY A SMALL AMOUNT TO FACE ONCE A DAY 07/17/19   [provider]  mupirocin  ointment (BACTROBAN ) 2 % APPLY A SMALL AMOUNT TO LEFT EAR ONCE A DAY 08/04/19    [provider]  mupirocin  ointment (BACTROBAN ) 2 % Apply to skin qd-bid 04/19/22   Hester Alm BROCKS, MD  propranolol (INDERAL) 10 MG tablet Take by mouth. 02/24/20   [provider]  warfarin (COUMADIN) 5 MG tablet Take by mouth. 06/18/11   [provider]    Social History:  has no history on file for tobacco use, alcohol use, and drug use.    Physical Exam: Vitals:   07/12/24 1330 07/12/24 1332 07/12/24 1336 07/12/24 1400  BP: 105/80   107/62  Pulse: (!) 122     Resp: (!) 28 (!) 23    Temp:   98.4 F (36.9 C)   TempSrc:   Oral   SpO2: 94%   96%  Weight:      Height:        Gen: NAD, chronically ill appearing female.  HENT: NCAT CV: IRIR Lung: CTAB Abd: No TTP, normal bowel sounds MSK: No asymmetry, good bulk and tone Neuro: alert but aphasic. Unable to ascertain orientation. CNII-VI intact, strength in BUE 4/5, and BLE 5/5, right sided  facial droop   Labs on Admission: I have personally reviewed following labs and imaging studies  CBC: Recent Labs  Lab 07/12/24 0848  WBC 10.7*  NEUTROABS 6.9  HGB 12.1  HCT 36.9  MCV 92.5  PLT 465*   Basic Metabolic Panel: Recent Labs  Lab 07/12/24 0848 07/12/24 1052  NA 135  --   K 3.2*  --   CL 99  --   CO2 25  --   GLUCOSE 104*  --   BUN 12  --   CREATININE 0.61  --   CALCIUM 9.2  --   MG  --  1.9   GFR: Estimated Creatinine Clearance: 35.5 mL/min (by C-G formula based on SCr of 0.61 mg/dL). Liver Function Tests: Recent Labs  Lab 07/12/24 0848  AST 24  ALT 15  ALKPHOS 92  BILITOT 1.2  PROT 7.8  ALBUMIN 3.4*   No results for input(s): LIPASE, AMYLASE in the last 168 hours. No results for input(s): AMMONIA in the last 168 hours. Coagulation Profile: Recent Labs  Lab 07/12/24 0848  INR 1.1   Cardiac Enzymes: No results for input(s): CKTOTAL, CKMB, CKMBINDEX, TROPONINI, TROPONINIHS in the last 168 hours. BNP (last 3 results) No results for input(s): BNP  in the last 8760 hours. HbA1C: No results for input(s): HGBA1C in the last 72 hours. CBG: Recent Labs  Lab 07/12/24 0845  GLUCAP 113*   Lipid Profile: No results for input(s): CHOL, HDL, LDLCALC, TRIG, CHOLHDL, LDLDIRECT in the last 72 hours. Thyroid Function Tests: No results for input(s): TSH, T4TOTAL, FREET4, T3FREE, THYROIDAB in the last 72 hours. Anemia Panel: No results for input(s): VITAMINB12, FOLATE, FERRITIN, TIBC, IRON, RETICCTPCT in the last 72 hours. Urine analysis: No results found for: COLORURINE, APPEARANCEUR, LABSPEC, PHURINE, GLUCOSEU, HGBUR, BILIRUBINUR, KETONESUR, PROTEINUR, UROBILINOGEN, NITRITE, LEUKOCYTESUR  Radiological Exams on Admission: I have personally reviewed images MR BRAIN WO CONTRAST Result Date: 07/12/2024 EXAM: MRI BRAIN WITHOUT CONTRAST 07/12/2024 11:53:20 AM TECHNIQUE: Multiplanar multisequence MRI of the head/brain was performed without the administration of intravenous contrast. COMPARISON: CT angiogram of the head and neck dated 07/12/2024. CLINICAL HISTORY: Neuro deficit, acute, stroke suspected. FINDINGS: BRAIN AND VENTRICLES: Acute non hemorrhagic infarction in the left precentral gyrus. There is age-related atrophy and mild-to-moderate cerebral white matter disease. There are chronic lacunar infarcts again demonstrated within the internal capsules bilaterally. No other acute intracranial hemorrhage. No mass. No midline shift. No hydrocephalus. The sella is unremarkable. Normal flow voids. ORBITS: The patient is status post bilateral lens replacement. SINUSES AND MASTOIDS: There is moderate mucosal disease within the paranasal sinuses. BONES AND SOFT TISSUES: Normal marrow signal. No soft tissue abnormality. IMPRESSION: 1. Restricted diffusion within the cortex of the left precentral gyrus compatible with acute non hemorrhagic infarction. 2. Age-related atrophy and mild-to-moderate cerebral  white matter disease. 3. Chronic lacunar infarcts within the internal capsules bilaterally. 4. Moderate paranasal sinus mucosal disease. Electronically signed by: Evalene Coho MD 07/12/2024 12:03 PM EST RP Workstation: [id number]   CT ANGIO HEAD NECK W WO CM Result Date: 07/12/2024 EXAM: CTA HEAD AND NECK  WITH 07/12/2024 09:12:00 AM TECHNIQUE: CTA of the head and neck was performed with the administration of 75 mL of iohexol  (OMNIPAQUE ) 350 MG/ML injection. Multiplanar 2D and/or 3D reformatted images are provided for review. Automated exposure control, iterative reconstruction, and/or weight based adjustment of the mA/kV was utilized to reduce the radiation dose to as low as reasonably achievable. Stenosis of the internal carotid arteries measured using NASCET criteria. COMPARISON:  None available CLINICAL HISTORY: Neuro deficit, acute, stroke suspected; R facial droop L sided extremity weakness. FINDINGS: CTA NECK: AORTIC ARCH AND ARCH VESSELS: No dissection or arterial injury. No significant stenosis of the brachiocephalic or subclavian arteries. CERVICAL CAROTID ARTERIES: The common carotid and internal carotid arteries are tortuous. The right common carotid artery has a retropharyngeal bend, which displaces the esophagus to the left. There is mild calcific atheromatous disease within the carotid bulbs, but no flow-limiting stenosis. No dissection or arterial injury. CERVICAL VERTEBRAL ARTERIES: The vertebral arteries are codominant. No dissection, arterial injury, or significant stenosis. LUNGS AND MEDIASTINUM: Unremarkable. SOFT TISSUES: No acute abnormality. BONES: No acute abnormality. CTA HEAD: ANTERIOR CIRCULATION: There is mild calcific plaque within the carotid siphons with no flow-limiting stenosis. No significant stenosis of the anterior cerebral arteries. No significant stenosis of the middle cerebral arteries. No aneurysm. POSTERIOR CIRCULATION: No significant stenosis of the posterior cerebral  arteries. No significant stenosis of the basilar artery. No significant stenosis of the vertebral arteries. No aneurysm. OTHER: There is paranasal sinus disease present. No dural venous sinus thrombosis on this non-dedicated study. The above findings were communicated to Doctor Matthews at 9:19 AM on 07/12/2024. IMPRESSION: 1. No large vessel occlusion, hemodynamically significant stenosis, or aneurysm in the head or neck. 2. Mild calcific atheromatous disease within the carotid bulbs and carotid siphons without flow-limiting stenosis. 3. Tortuous common carotid and internal carotid arteries, with the right common carotid artery having a retropharyngeal bend displacing the esophagus to the left. 4. Findings communicated to Dr. Matthews at 9:19 AM on 07/12/2024. Electronically signed by: Evalene Coho MD 07/12/2024 09:23 AM EST RP Workstation: [id number]   CT HEAD CODE STROKE WO CONTRAST Result Date: 07/12/2024 EXAM: CT HEAD WITHOUT CONTRAST 07/12/2024 08:55:00 AM TECHNIQUE: CT of the head was performed without the administration of intravenous contrast. Automated exposure control, iterative reconstruction, and/or weight based adjustment of the mA/kV was utilized to reduce the radiation dose to as low as reasonably achievable. COMPARISON: None available. CLINICAL HISTORY: Neuro deficit, acute, stroke suspected. FINDINGS: BRAIN AND VENTRICLES: No acute hemorrhage. No evidence of acute infarct. No hydrocephalus. No extra-axial collection. No mass effect or midline shift. There is age-related volume loss. There are chronic lacunar infarcts within the internal capsules bilaterally. ORBITS: No acute abnormality. The patient is status post bilateral lens replacement. SINUSES: There is an air fluid level within the right maxillary sinus and the left sphenoid sinus. There is moderate opacification of the ethmoid and sphenoid sinuses and the floor of the right frontal sinus. SOFT TISSUES AND SKULL: No acute soft tissue  abnormality. No skull fracture. Findings were communicated to Doctor Matthews at 08:58 AM 07/12/2024. [place] Stroke Program Early CT Score (ASPECTS) ----- Ganglionic (caudate, IC, lentiform nucleus, insula, M1-M3): 7 Supraganglionic (M4-M6): 3 Total: 10 IMPRESSION: 1. No acute intracranial abnormality to suggest acute infarct or hemorrhage in the setting of suspected acute stroke. 2. Age-related volume loss and chronic lacunar infarcts within the internal capsules bilaterally. 3. Moderate paranasal sinus disease with air-fluid levels in the right maxillary and left sphenoid sinuses, which can be seen with acute sinusitis. 4. These findings were communicated to Dr. Matthews at 08:58 AM on 07/12/2024. ASPECTS: 10. Electronically signed by: Evalene Coho MD 07/12/2024 09:00 AM EST RP Workstation: [id number]    EKG: My personal interpretation of EKG shows: A-fib with RVR without any acute ST changes    Assessment/Plan Principal Problem:   Cerebral infarction [facility]) Active Problems:   GERD (gastroesophageal reflux disease)  Essential hypertension   Hyperlipidemia   Parkinson's disease (HCC)   Hypothyroidism   Atrial fibrillation, chronic (HCC)   Glaucoma   Osteoporosis   Cerebral Infarction Pt with symptoms of aphasia found to have a stroke. Pt not a candidate for tPA. Neurology consulted, appreciate their recommendations.  - Allow for permissive HTN in the setting of acute stroke - ASA 81 mg daily but discontinue after starting anticoagulant.  Hold anticoagulant until day #3. - Echocardiogram  - A1C  - Lipid panel  - Tele monitoring  - SLP eval - PT/OT - Repeat head CT with any neurochanges  Failure to thrive: Discussed patient with recent admission and severe weakness and now with a setback of stroke at risk for poor outcomes.  Discussed involving palliative care.  Will readdress later today.  Acute encephalopathy: Patient following all commands and wants to answer questions but unable  to speak.  This appears to be very consistent with her area of stroke.  Will order studies recommended by neurology.  A-fib with RVR: Patient with chronic atrial fibrillation.  Currently in RVR with rates improving on diltiazem .  Will continue diltiazem  and restart home medicines once able.  If patient becomes hypotensive we will change her to amiodarone.  Follow-up electrolytes as needed.  Echocardiogram ordered.  Hypokalemia: Will replete with IV potassium and check magnesium  as patient is in A-fib with RVR   Chronic Problems: Restart home meds once med reconciliation is completed and patient able to tolerate p.o. intake.  HTN: Permissive hypertension as above HLD: continue home meds Parkinson's disease: Continue home meds Glaucoma: Continue home eyedrops Hypothyroidism: continue home synthroid  Superficial bite wound: Consult wound care, due to times  VTE prophylaxis:  Eliquis  Diet: Code Status:  DNR/DNI(Do NOT Intubate) Telemetry:  Admission status: Inpatient, Telemetry bed Patient is from: Home Anticipated d/c is to: Home Anticipated d/c is in: 2-3 days   Family Communication: Updated at bedside  Consults called: Neurology   Severity of Illness: The appropriate patient status for this patient is INPATIENT. Inpatient status is judged to be reasonable and necessary in order to provide the required intensity of service to ensure the patient's safety. The patient's presenting symptoms, physical exam findings, and initial radiographic and laboratory data in the context of their chronic comorbidities is felt to place them at high risk for further clinical deterioration. Furthermore, it is not anticipated that the patient will be medically stable for discharge from the hospital within 2 midnights of admission.   * I certify that at the point of admission it is my clinical judgment that the patient will require inpatient hospital care spanning beyond 2 midnights from the point of  admission due to high intensity of service, high risk for further deterioration and high frequency of surveillance required.DEWAINE Morene Bathe, MD [place]ol[place] DEL. [facility]     [1]  Allergies Allergen Reactions   Sulfa Antibiotics Dermatitis and Hives    Hives  Hives  Hives  sulfamethoxazole   Simvastatin Other (See Comments)    Joint pains   Sulfamethoxazole Rash    Hives   "

## 2024-07-13 ENCOUNTER — Inpatient Hospital Stay
Admission: EM | Admit: 2024-07-13 | Discharge: 2024-07-13 | Disposition: A | Source: Home / Self Care | Attending: Neurology | Admitting: Neurology

## 2024-07-13 ENCOUNTER — Inpatient Hospital Stay

## 2024-07-13 DIAGNOSIS — I63412 Cerebral infarction due to embolism of left middle cerebral artery: Secondary | ICD-10-CM

## 2024-07-13 DIAGNOSIS — I6389 Other cerebral infarction: Secondary | ICD-10-CM | POA: Diagnosis not present

## 2024-07-13 DIAGNOSIS — Z7982 Long term (current) use of aspirin: Secondary | ICD-10-CM

## 2024-07-13 DIAGNOSIS — I6340 Cerebral infarction due to embolism of unspecified cerebral artery: Secondary | ICD-10-CM | POA: Diagnosis not present

## 2024-07-13 LAB — BASIC METABOLIC PANEL WITH GFR
Anion gap: 14 (ref 5–15)
BUN: 14 mg/dL (ref 8–23)
CO2: 20 mmol/L — ABNORMAL LOW (ref 22–32)
Calcium: 8.6 mg/dL — ABNORMAL LOW (ref 8.9–10.3)
Chloride: 101 mmol/L (ref 98–111)
Creatinine, Ser: 0.86 mg/dL (ref 0.44–1.00)
GFR, Estimated: >60 mL/min
Glucose, Bld: 109 mg/dL — ABNORMAL HIGH (ref 70–99)
Potassium: 4.1 mmol/L (ref 3.5–5.1)
Sodium: 134 mmol/L — ABNORMAL LOW (ref 135–145)

## 2024-07-13 LAB — LIPID PANEL
Cholesterol: 123 mg/dL (ref 0–200)
HDL: 36 mg/dL — ABNORMAL LOW
LDL Cholesterol: 75 mg/dL (ref 0–99)
Total CHOL/HDL Ratio: 3.4 ratio
Triglycerides: 58 mg/dL
VLDL: 12 mg/dL (ref 0–40)

## 2024-07-13 LAB — ECHOCARDIOGRAM COMPLETE BUBBLE STUDY
Calc EF: 52.5 %
P 1/2 time: 446 ms
S' Lateral: 2.6 cm
Single Plane A2C EF: 50.2 %
Single Plane A4C EF: 50.2 %

## 2024-07-13 LAB — CBC
HCT: 35.7 % — ABNORMAL LOW (ref 36.0–46.0)
Hemoglobin: 11.5 g/dL — ABNORMAL LOW (ref 12.0–15.0)
MCH: 30.2 pg (ref 26.0–34.0)
MCHC: 32.2 g/dL (ref 30.0–36.0)
MCV: 93.7 fL (ref 80.0–100.0)
Platelets: 412 K/uL — ABNORMAL HIGH (ref 150–400)
RBC: 3.81 MIL/uL — ABNORMAL LOW (ref 3.87–5.11)
RDW: 15.7 % — ABNORMAL HIGH (ref 11.5–15.5)
WBC: 11.9 K/uL — ABNORMAL HIGH (ref 4.0–10.5)
nRBC: 0 % (ref 0.0–0.2)

## 2024-07-13 LAB — SYPHILIS: RPR W/REFLEX TO RPR TITER AND TREPONEMAL ANTIBODIES, TRADITIONAL SCREENING AND DIAGNOSIS ALGORITHM: RPR Ser Ql: NONREACTIVE

## 2024-07-13 LAB — GLUCOSE, CAPILLARY
Glucose-Capillary: 128 mg/dL — ABNORMAL HIGH (ref 70–99)
Glucose-Capillary: 173 mg/dL — ABNORMAL HIGH (ref 70–99)

## 2024-07-13 LAB — HIV ANTIBODY (ROUTINE TESTING W REFLEX): HIV Screen 4th Generation wRfx: NONREACTIVE

## 2024-07-13 MED ORDER — LEVOTHYROXINE SODIUM 50 MCG PO TABS
50.0000 ug | ORAL_TABLET | ORAL | Status: DC
  Administered 2024-07-13 – 2024-07-17 (×3): 50 ug via ORAL
  Filled 2024-07-13 (×3): qty 1

## 2024-07-13 MED ORDER — CARBIDOPA-LEVODOPA 25-100 MG PO TABS
2.0000 | ORAL_TABLET | Freq: Three times a day (TID) | ORAL | Status: DC
  Administered 2024-07-13 – 2024-07-15 (×7): 2 via ORAL
  Filled 2024-07-13 (×7): qty 2

## 2024-07-13 MED ORDER — PRAMIPEXOLE DIHYDROCHLORIDE 0.25 MG PO TABS
0.5000 mg | ORAL_TABLET | Freq: Every day | ORAL | Status: DC
  Administered 2024-07-13 – 2024-07-16 (×4): 0.5 mg via ORAL
  Filled 2024-07-13 (×5): qty 2

## 2024-07-13 MED ORDER — PRESERVISION AREDS 2 PO CAPS: 1.0000 | ORAL_CAPSULE | Freq: Two times a day (BID) | ORAL | Status: DC

## 2024-07-13 MED ORDER — ASPIRIN 81 MG PO TBEC
81.0000 mg | DELAYED_RELEASE_TABLET | Freq: Every day | ORAL | Status: DC
  Administered 2024-07-13 – 2024-07-17 (×5): 81 mg via ORAL
  Filled 2024-07-13 (×5): qty 1

## 2024-07-13 MED ORDER — DORZOLAMIDE HCL-TIMOLOL MAL 2-0.5 % OP SOLN
1.0000 [drp] | Freq: Two times a day (BID) | OPHTHALMIC | Status: DC
  Administered 2024-07-13 – 2024-07-17 (×9): 1 [drp] via OPHTHALMIC
  Filled 2024-07-13: qty 10

## 2024-07-13 MED ORDER — PREDNISOLONE ACETATE 1 % OP SUSP
1.0000 [drp] | Freq: Every day | OPHTHALMIC | Status: DC
  Administered 2024-07-13 – 2024-07-17 (×5): 1 [drp] via OPHTHALMIC
  Filled 2024-07-13: qty 1

## 2024-07-13 MED ORDER — BRIMONIDINE TARTRATE 0.2 % OP SOLN
1.0000 [drp] | Freq: Two times a day (BID) | OPHTHALMIC | Status: DC
  Administered 2024-07-13 – 2024-07-17 (×9): 1 [drp] via OPHTHALMIC
  Filled 2024-07-13: qty 5

## 2024-07-13 MED ORDER — DILTIAZEM HCL ER COATED BEADS 180 MG PO CP24
180.0000 mg | ORAL_CAPSULE | Freq: Every day | ORAL | Status: DC
  Administered 2024-07-13 – 2024-07-16 (×4): 180 mg via ORAL
  Filled 2024-07-13 (×4): qty 1

## 2024-07-13 MED ORDER — PRIMIDONE 50 MG PO TABS
50.0000 mg | ORAL_TABLET | Freq: Two times a day (BID) | ORAL | Status: DC
  Administered 2024-07-13 – 2024-07-17 (×9): 50 mg via ORAL
  Filled 2024-07-13 (×10): qty 1

## 2024-07-13 MED ORDER — LATANOPROST 0.005 % OP SOLN
1.0000 [drp] | Freq: Every day | OPHTHALMIC | Status: DC
  Administered 2024-07-13 – 2024-07-16 (×4): 1 [drp] via OPHTHALMIC
  Filled 2024-07-13: qty 2.5

## 2024-07-13 MED ORDER — LEVOTHYROXINE SODIUM 25 MCG PO TABS
25.0000 ug | ORAL_TABLET | ORAL | Status: DC
  Administered 2024-07-14 – 2024-07-16 (×2): 25 ug via ORAL
  Filled 2024-07-13 (×2): qty 1

## 2024-07-13 MED ORDER — PROSIGHT PO TABS
1.0000 | ORAL_TABLET | Freq: Every day | ORAL | Status: DC
  Administered 2024-07-13 – 2024-07-17 (×5): 1 via ORAL
  Filled 2024-07-13 (×5): qty 1

## 2024-07-13 NOTE — Plan of Care (Signed)
  Problem: Activity: Goal: Risk for activity intolerance will decrease Outcome: Progressing   Problem: Elimination: Goal: Will not experience complications related to bowel motility Outcome: Progressing Goal: Will not experience complications related to urinary retention Outcome: Progressing   Problem: Pain Managment: Goal: General experience of comfort will improve and/or be controlled Outcome: Progressing   Problem: Safety: Goal: Ability to remain free from injury will improve Outcome: Progressing   Problem: Skin Integrity: Goal: Risk for impaired skin integrity will decrease Outcome: Progressing

## 2024-07-13 NOTE — Evaluation (Signed)
 Physical Therapy Evaluation Patient Details Name: Cassandra Glover MRN: [id number] DOB: 1931-11-26 Today's Date: 07/13/2024  History of Present Illness  Pt is a 89 y.o. 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 PMH significant for HTN, HLD, AFIB, CHF, basal cell carcinoma, Parkinsons, rectal prolapse  Clinical Impression  Pt somewhat lethargic and non-verbal but able to follow some 1-step commands with extra time and multi-modal cuing.  Pt required near total assist with bed mobility tasks although some effort from pt to assist was noted.  Once in static sitting at the EOB pt presented with frequent L lateral and posterior lean/instability that temporarily improved with weight shifting activities.  Pt will benefit from a trial of continued PT services upon discharge to safely address deficits listed in patient problem list for decreased caregiver assistance and eventual return to PLOF.           If plan is discharge home, recommend the following: Two people to help with walking and/or transfers;Two people to help with bathing/dressing/bathroom;Assistance with feeding;Assistance with cooking/housework;Direct supervision/assist for medications management;Direct supervision/assist for financial management;Assist for transportation;Help with stairs or ramp for entrance   Can travel by private vehicle   No    Equipment Recommendations Other (comment) (TBD at next venue of care)  Recommendations for Other Services       Functional Status Assessment Patient has had a recent decline in their functional status and/or demonstrates limited ability to make significant improvements in function in a reasonable and predictable amount of time     Precautions / Restrictions Precautions Precautions: Fall Recall of Precautions/Restrictions: Impaired Precaution/Restrictions Comments: pt non-verbal since recent CVA Restrictions Weight Bearing  Restrictions Per Provider Order: No      Mobility  Bed Mobility Overal bed mobility: Needs Assistance Bed Mobility: Rolling, Sidelying to Sit, Sit to Supine Rolling: Max assist, +2 for physical assistance Sidelying to sit: Max assist, +2 for physical assistance   Sit to supine: +2 for physical assistance, Max assist   General bed mobility comments: Max multi-modal cues for sequencing with pt putting forth noted effort but ultimately required +2 max A for all bed mobility tasks    Transfers                   General transfer comment: Deferred attempting due to very poor static sitting balance    Ambulation/Gait                  Stairs            Wheelchair Mobility     Tilt Bed    Modified Rankin (Stroke Patients Only)       Balance Overall balance assessment: Needs assistance Sitting-balance support: Feet supported, Bilateral upper extremity supported Sitting balance-Leahy Scale: Poor Sitting balance - Comments: Frequent posterior and L lateral lean Postural control: Posterior lean, Left lateral lean     Standing balance comment: Unable/unsafe to attempt                             Pertinent Vitals/Pain Pain Assessment Pain Assessment: PAINAD Breathing: occasional labored breathing, short period of hyperventilation Negative Vocalization: occasional moan/groan, low speech, negative/disapproving quality Facial Expression: smiling or inexpressive Body Language: tense, distressed pacing, fidgeting Consolability: no need to console PAINAD Score: 3 Pain Intervention(s): Repositioned, Monitored during session    Home Living Family/patient expects to be discharged to:: Private residence Living Arrangements:  Spouse/significant other Available Help at Discharge: Family;Available PRN/intermittently Type of Home: House Home Access: Ramped entrance       Home Layout: One level Home Equipment: Wheelchair - Doctor, Hospital (2 wheels);Rollator (4 wheels) Additional Comments: pt most recently at [facility] for STR, dependent assist for ADLs, +1-2 for transfers.    Prior Function Prior Level of Function : Needs assist       Physical Assist : Mobility (physical);ADLs (physical) Mobility (physical): Bed mobility;Transfers ADLs (physical): Feeding;Grooming;Bathing;Dressing;Toileting;IADLs Mobility Comments: transferring at [facility] with +1-2 assist to wheelchair, non-ambulatory at baseline for > 1 year, uses power wheelchair ADLs Comments: dependent assist for ADLs at [facility], ~3 wks ago pt was mod independent for ADLs/transfers per spouse     Extremity/Trunk Assessment   Upper Extremity Assessment Upper Extremity Assessment: Defer to OT evaluation (p)    Lower Extremity Assessment Lower Extremity Assessment: Difficult to assess due to impaired cognition;Generalized weakness    Cervical / Trunk Assessment Cervical / Trunk Assessment: Kyphotic  Communication   Communication Communication: Impaired Factors Affecting Communication: Difficulty expressing self;Reduced clarity of speech;Other (comment) (non-verbal)    Cognition Arousal: Lethargic Behavior During Therapy: Flat affect   PT - Cognitive impairments: Difficult to assess Difficult to assess due to: Impaired communication, Level of arousal                       Following commands: Impaired Following commands impaired: Follows one step commands inconsistently     Cueing Cueing Techniques: Gestural cues, Tactile cues, Verbal cues     General Comments      Exercises Other Exercises Other Exercises: Extensive R lateral and anterior weight shiftitng activities in sitting to address L lateral and posterior lean   Assessment/Plan    PT Assessment Patient needs continued PT services  PT Problem List Decreased strength;Decreased activity tolerance;Decreased balance;Decreased mobility;Decreased knowledge of use of DME       PT  Treatment Interventions DME instruction;Functional mobility training;Therapeutic activities;Therapeutic exercise;Balance training;Patient/family education    PT Goals (Current goals can be found in the Care Plan section)  Acute Rehab PT Goals PT Goal Formulation: Patient unable to participate in goal setting Time For Goal Achievement: 07/26/24 Potential to Achieve Goals: Fair    Frequency Min 1X/week     Co-evaluation PT/OT/SLP Co-Evaluation/Treatment: Yes Reason for Co-Treatment: Complexity of the patient's impairments (multi-system involvement);For patient/therapist safety PT goals addressed during session: Mobility/safety with mobility;Balance;Strengthening/ROM         AM-PAC PT 6 Clicks Mobility  Outcome Measure Help needed turning from your back to your side while in a flat bed without using bedrails?: Total Help needed moving from lying on your back to sitting on the side of a flat bed without using bedrails?: Total Help needed moving to and from a bed to a chair (including a wheelchair)?: Total Help needed standing up from a chair using your arms (e.g., wheelchair or bedside chair)?: Total Help needed to walk in hospital room?: Total Help needed climbing 3-5 steps with a railing? : Total 6 Click Score: 6    End of Session   Activity Tolerance: Patient tolerated treatment well Patient left: in bed;with call bell/phone within reach;with bed alarm set;with family/visitor present;Other (comment) (Staff entered room for imaging at end of session) Nurse Communication: Mobility status PT Visit Diagnosis: Muscle weakness (generalized) (M62.81);Unsteadiness on feet (R26.81);Adult, failure to thrive (R62.7);Hemiplegia and hemiparesis Hemiplegia - caused by: Cerebral infarction    Time: 0950-1002 PT Time Calculation (min) (ACUTE ONLY):  12 min   Charges:   PT Evaluation $PT Eval Moderate Complexity: 1 Mod   PT General Charges $$ ACUTE PT VISIT: 1 Visit    D. Scott  Hermann Dottavio PT, DPT 07/13/24, 11:11 AM

## 2024-07-13 NOTE — Progress Notes (Signed)
 " Progress Note   Patient: Cassandra Glover FMW:[id number] DOB: [birth year] DOA: 07/12/2024     1 DOS: the patient was seen and examined on 07/13/2024   Brief hospital course:  Cassandra Glover is a 89 y.o. year old female with medical history of hypertension, hyperlipidemia, atrial fibrillation, CHF, hypothyroidism presenting to the ED with facial droop and left-sided weakness. LKW was 6 am per facility. On arrival to the ED patient was noted to be HDS stable.  Lab work and imaging obtained.  CBC with mild leukocytosis and mild thrombocytosis, normal hemoglobin.  CMP with mild hypokalemia at 3.2 and mild hypoalbuminemia.  Troponin mildly elevated but flat.  Code stroke initiated on patient arrival CT head without any acute findings.  CTA with no large vessel occlusion.  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  Patient also noted to be in A-fib with RVR and multiple rounds of diltiazem  given with decrease in heart rate but RVR persistent so diltiazem  gtt. started.  Given need for further care, [facility] contacted for admission.   Review of Systems: As mentioned in the history of present illness. All other systems reviewed and are negative.      Assessment and Plan:   Acute CVA (Hospital Day 1) Related to patient's known atrial fibrillation Patient with a history of A-fib who presented to the ER for evaluation of left facial droop/Left upper and lower extremity weakness as well as aphasia. Aphasia persists but patient is able to move her upper extremities Was not a candidate for TKA since she was on therapeutic Lovenox  for atrial fibrillation CT head on admission showed no acute intracranial abnormality to suggest acute infarct or hemorrhage in the setting of suspected acute stroke. Age-related volume loss and chronic lacunar infarcts within the internal capsules bilaterally. Moderate paranasal sinus disease with air-fluid levels in the right maxillary and  left sphenoid sinuses, which can be seen with acute sinusitis. CTA of the head and neck showed no large vessel occlusion, hemodynamically significant stenosis, or aneurysm in the head or neck. MRI of the brain showed restricted diffusion within the cortex of the left precentral gyrus compatible with acute non hemorrhagic infarction. Age-related atrophy and mild-to-moderate cerebral white matter disease. Chronic lacunar infarcts within the internal capsules bilaterally. Moderate paranasal sinus mucosal disease. Appreciate neurology input.  Recommends to hold Lovenox  and may start heparin drip on hospital day 3 if exam is stable or improved Continue aspirin .  Discontinue aspirin  once patient is started on a heparin drip Patient is not on a statin due to allergy (myalgia) Follow-up results of 2D echocardiogram Appreciate speech therapy input, recommends to continue n.p.o. for now for patient may receive medications crushed Follow-up results with PT/OT consult   Atrial fibrillation with rapid ventricular rate Patient has been on a Cardizem  drip for rate control Will resume oral Cardizem  and will titrate off the drip Currently not on anticoagulation due to acute stroke   Parkinson's disease Continue Sinemet  and primidone    Hypokalemia Supplemented Magnesium  level within normal limits    Hypothyroidism Continue Synthroid    Adult failure to thrive Overall prognosis is poor in the setting of acute stroke and Parkinson's disease Palliative care consult      Subjective: Aphasia persists but able to follow simple commands  Physical Exam: Vitals:   07/13/24 1100 07/13/24 1115 07/13/24 1130 07/13/24 1145  BP: (!) 141/82  (!) 97/53   Pulse: 97 94 (!) 108 88  Resp: (!) 23 (!)  22 (!) 23 (!) 26  Temp:      TempSrc:      SpO2: 92% 91% 91% 91%  Weight:      Height:       Gen: NAD, chronically ill appearing female.  Aphasic HENT: NCAT CV: Irregularly irregular, tachycardic Lung:  CTAB/L Abd: Bowel sounds present, soft, non tender MSK: No asymmetry, good bulk and tone Neuro: alert but aphasic. Unable to ascertain orientation. CNII-VI intact, strength in BUE 4/5, and BLE 5/5, able to move upper extremities.  Bilateral lower extremity weakness.   Data Reviewed: Labs reviewed.  Sodium 134, white count 11.9, hemoglobin 11.5 Labs reviewed  Family Communication: Plan of care was discussed with patient's husband at the bedside.  All questions and concerns have been addressed.  He verbalizes understanding and agrees with the plan  Disposition: Status is: Inpatient Remains inpatient appropriate because: Stroke workup  Planned Discharge Destination: TBD    Time spent: 50 minutes  Author: Aimee Somerset, MD 07/13/2024 12:22 PM  For on call review [email or web address].  "

## 2024-07-13 NOTE — Evaluation (Signed)
 Clinical/Bedside Swallow Evaluation Patient Details  Name: Cassandra Glover MRN: [id number] Date of Birth: 1931-10-19  Today's Date: 07/13/2024 Time: SLP Start Time (ACUTE ONLY): 0855 SLP Stop Time (ACUTE ONLY): 0915 SLP Time Calculation (min) (ACUTE ONLY): 20 min  Past Medical History:  Past Medical History:  Diagnosis Date   Actinic keratosis    Basal cell carcinoma 08/04/2019   left superior helix. Excised 09/01/2019, margins free.   Basal cell carcinoma 08/15/2017   R shoulder posterior    Basal cell carcinoma 01/06/2015   Left distal lateral pretibial,  left medial ankle, right proximal lateral pretibial,   Basal cell carcinoma 11/29/2014   L lateral inferior knee   Basal cell carcinoma 08/26/2014   anterior chin   Basal cell carcinoma 02/25/2014   left forearm and left temple hairline   Basal cell carcinoma 02/03/2013    left post auricular auricular inferior inferior neck and ) right paraspinal mid to upper back / excised    Basal cell carcinoma 09/21/2008   L pretibial 0.8cm  and LEFT MEDIAL SUP CALF, 1.1 X 0.6CM   Basal cell carcinoma 10/04/2009   R distal pretibial    History of basal cell carcinoma (BCC) 07/15/2019   left superior helix and  right dorsum proximal forearm   History of basal cell carcinoma (BCC) 09/23/2007   R SUP MEDIAL CALF ANT and R MID LAT PRETIBIAL   History of basal cell carcinoma (BCC) 05/04/2008   R TRICEP   Past Surgical History: History reviewed. No pertinent surgical history. HPI:  Per H&P, Cassandra Glover is a 89 y.o. year old female with medical history of hypertension, hyperlipidemia, atrial fibrillation, CHF, hypothyroidism presenting to the ED with facial droop and left-sided weakness. LKW was 6 am per facility. On arrival to the ED patient was noted to be HDS stable.  Lab work and imaging obtained.  CBC with mild leukocytosis and mild thrombocytosis, normal hemoglobin.  CMP with mild hypokalemia at 3.2 and mild hypoalbuminemia.   Troponin mildly elevated but flat.  Code stroke initiated on patient arrival CT head without any acute findings.  CTA with no large vessel occlusion.  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    Assessment / Plan / Recommendation  Clinical Impression  Pt seen for bedside swallow evaluation in the setting of acute CVA. Last dysphagia intervention noted 09/21/21, with MBSS from OSH revealing, safe and efficient pharyngeal functioning. There was trace transient penetration with thin liquids and no aspiration. Pharyngeal clearance is WFL. Spouse denied difficulty swallowing at baseline, with some reported pill dysphagia. Spouse reporting cough for ~2 weeks, with recent hospitalization regarding dehydration at OSH- pt on a regular solids diet during hospital course without reported issue. Given upper extremity tremor and visual deficits, spouse reports need to assist with feeding.   Today, pt alert upon therapist entrance, on room air, with O2 stabile in low 90s (91-94) t/o session. Noted congested cough at baseline, min reduced in strength. Overt left facial weakness/asymmetry, reduced lingual protrusion/lateralization. Trials completed of ice chips, tsp of nectar thick liquids, and puree. Both ice chips and nectar trials met with delayed cough and noted multiple swallow for clearance. However, O2 remained stable for duration of trials. Oral deficits across liquids and solids for anterior loss, reduced awareness of bolus, and prolonged propulsion.   Based on pt's history (Parkinson's disease, GERD), overt coughing with PO, and acute neuro injury, pt is at significant risk for aspiration.  Recommend pt remain NPO, with allowance of medications crushed in puree. Frequent oral care. Education shared with spouse regarding risk factors for aspiration and rationale for current recommendations for NPO. Spouse reported understanding. SLP will follow closely to determine PO  readiness vs need for instrumental assessment. MD and RN aware of recommendations.   SLP Visit Diagnosis: Dysphagia, unspecified (R13.10) (in the setting of acute CVA and baseline Parkinson's and GERD)    Aspiration Risk  Moderate aspiration risk    Diet Recommendation   NPO  Medication Administration: Crushed with puree    Other Recommendations Oral Care Recommendations: Oral care QID;Staff/trained caregiver to provide oral care     Swallow Evaluation Recommendations     Assistance Recommended at Discharge    Functional Status Assessment Patient has had a recent decline in their functional status and/or demonstrates limited ability to make significant improvements in function in a reasonable and predictable amount of time  Frequency and Duration min 2x/week  2 weeks       Prognosis Prognosis for improved oropharyngeal function: Fair Barriers to Reach Goals: Severity of deficits (premorbid state)      Swallow Study   General Date of Onset: 07/13/24 HPI: Per H&P, Cassandra Glover is a 89 y.o. year old female with medical history of hypertension, hyperlipidemia, atrial fibrillation, CHF, hypothyroidism presenting to the ED with facial droop and left-sided weakness. LKW was 6 am per facility. On arrival to the ED patient was noted to be HDS stable.  Lab work and imaging obtained.  CBC with mild leukocytosis and mild thrombocytosis, normal hemoglobin.  CMP with mild hypokalemia at 3.2 and mild hypoalbuminemia.  Troponin mildly elevated but flat.  Code stroke initiated on patient arrival CT head without any acute findings.  CTA with no large vessel occlusion.  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 Type of Study: Bedside Swallow Evaluation Previous Swallow Assessment: OSH in 2023- revealing WFL oropharyngeal function Diet Prior to this Study: NPO Temperature Spikes Noted: No (99.3- 11.9) Respiratory Status: Room air History of Recent  Intubation: No Behavior/Cognition: Alert;Confused;Cooperative Oral Cavity Assessment: Within Functional Limits Oral Care Completed by SLP: Yes Oral Cavity - Dentition: Adequate natural dentition Self-Feeding Abilities: Total assist Patient Positioning: Upright in bed Baseline Vocal Quality: Not observed Volitional Cough: Cognitively unable to elicit (spontaneous cough- congested) Volitional Swallow: Able to elicit    Oral/Motor/Sensory Function Overall Oral Motor/Sensory Function: Moderate impairment Facial ROM: Reduced left Facial Symmetry: Abnormal symmetry left Facial Strength: Reduced left Lingual ROM:  (limited assessment- reduced protrusion and lateralization) Lingual Symmetry: Within Functional Limits Lingual Strength: Reduced Velum: Within Functional Limits Mandible: Within Functional Limits   Ice Chips Ice chips: Impaired Presentation: Spoon Oral Phase Impairments: Reduced labial seal;Poor awareness of bolus Oral Phase Functional Implications: Prolonged oral transit Pharyngeal Phase Impairments: Cough - Delayed;Multiple swallows   Thin Liquid Thin Liquid: Not tested    Nectar Thick Nectar Thick Liquid: Impaired Presentation: Spoon Oral Phase Impairments: Poor awareness of bolus Pharyngeal Phase Impairments: Cough - Delayed   Honey Thick Honey Thick Liquid: Not tested   Puree Puree: Within functional limits Presentation: Spoon   Solid     Solid: Not tested     Cassandra Bagot Clapp, MS, CCC-SLP Speech Language Pathologist Rehab Services; [facility] [phone] (ascom)   Cassandra Glover 07/13/2024,11:34 AM

## 2024-07-13 NOTE — Progress Notes (Addendum)
 1900:- Patient's BP dropped  to 89/51 with MAP of 63, HR at 66 but going upto 80's,      MD(Agbata) made aware of, Cardizem  stopped.

## 2024-07-13 NOTE — Progress Notes (Signed)
 SPIRITUAL CARE AND COUNSELING CONSULT NOTE   VISIT SUMMARY Chaplain provided spiritual/emotional support to Saunemin and family. Chaplain consulted with nurse team.   ZELPHIA GUILE                                                                                                                                                                      Type of Visit: Initial Care provided to:: Family Referral source: Chaplain assessment, Family Reason for visit: Routine spiritual support OnCall Visit: No   SPIRITUAL FRAMEWORK  Presenting Themes: Goals in life/care, Caregiving needs Community/Connection: Family Patient Stress Factors: Health changes Family Stress Factors: Health changes   GOALS   Self/Personal Goals: healing Clinical Care Goals: healing   INTERVENTIONS   Spiritual Care Interventions Made: Compassionate presence, Established relationship of care and support, Other (comment) (advocacy for pt. needs)    INTERVENTION OUTCOMES   Outcomes: Awareness of support, Patient family open to resources, Awareness around self/spiritual resourses  Chaplain provided compassionate presences, reflective listening, open ended questions, advocacy to elicit Ardelia and family feelings about health status, treatment plan, family support, and care giving desires. Patient advocated for Miela and family's resources.   SPIRITUAL CARE PLAN   Spiritual Care Issues Still Outstanding: Chaplain will continue to follow    If immediate needs arise, please contact [facility] 24 hour on call [phone]   Barabara Chess, Chaplain  07/13/2024 12:50 PM

## 2024-07-13 NOTE — Progress Notes (Signed)
 Subjective: Patient remains unable to communicate.  Family reports she has remained stable.    Objective: Current vital signs: BP (!) 97/53   Pulse 88   Temp 99.3 F (37.4 C) (Axillary)   Resp (!) 26   Ht 5' 3 (1.6 m)   Wt 49.5 kg   SpO2 91%   BMI 19.33 kg/m  Vital signs in last 24 hours: Temp:  [97.9 F (36.6 C)-99.3 F (37.4 C)] 99.3 F (37.4 C) (01/19 0900) Pulse Rate:  [85-125] 88 (01/19 1145) Resp:  [18-35] 26 (01/19 1145) BP: (96-141)/(53-96) 97/53 (01/19 1130) SpO2:  [90 %-97 %] 91 % (01/19 1145) Weight:  [49.5 kg] 49.5 kg (01/19 0500)  Intake/Output from previous day: 01/18 0701 - 01/19 0700 In: 1199.2 [I.V.:799.2; IV Piggyback:400] Out: -  Intake/Output this shift: No intake/output data recorded. Nutritional status:  Diet Order             Diet NPO time specified Except for: Sips with Meds  Diet effective now                   Neurologic Exam: Mental Status: Lethargic but easily aroused.  No speech.  Does not follow verbal commands.  Attempts to comply with nonverbal commands Cranial Nerves: II: Does no blink to bilateral confrontation III,IV, VI: extra-ocular motions intact bilaterally V,VII: mild right facial droop VIII: hearing normal bilaterally Motor: Able to lift both upper extremities against gravity but RUE drifts slowly back to bed.  Does not lift either leg off the bed.  Increased tone throughout Sensory: Withdraws and appreciates painful stimuli more on the left than the right Cerebellar: BUE tremor     Lab Results: Basic Metabolic Panel: Recent Labs  Lab 07/12/24 0848 07/12/24 1052 07/13/24 0436  NA 135  --  134*  K 3.2*  --  4.1  CL 99  --  101  CO2 25  --  20*  GLUCOSE 104*  --  109*  BUN 12  --  14  CREATININE 0.61  --  0.86  CALCIUM 9.2  --  8.6*  MG  --  1.9  --     Liver Function Tests: Recent Labs  Lab 07/12/24 0848  AST 24  ALT 15  ALKPHOS 92  BILITOT 1.2  PROT 7.8  ALBUMIN 3.4*   No results for  input(s): LIPASE, AMYLASE in the last 168 hours. Recent Labs  Lab 07/12/24 1652  AMMONIA 34    CBC: Recent Labs  Lab 07/12/24 0848 07/13/24 0436  WBC 10.7* 11.9*  NEUTROABS 6.9  --   HGB 12.1 11.5*  HCT 36.9 35.7*  MCV 92.5 93.7  PLT 465* 412*    Cardiac Enzymes: No results for input(s): CKTOTAL, CKMB, CKMBINDEX, TROPONINI in the last 168 hours.  Lipid Panel: Recent Labs  Lab 07/13/24 0436  CHOL 123  TRIG 58  HDL 36*  CHOLHDL 3.4  VLDL 12  LDLCALC 75    CBG: Recent Labs  Lab 07/12/24 0845 07/13/24 0903 07/13/24 1323  GLUCAP 113* 128* 173*    Microbiology: No results found for this or any previous visit.  Coagulation Studies: Recent Labs    07/12/24 0848  LABPROT 14.6  INR 1.1    Imaging: ECHOCARDIOGRAM COMPLETE BUBBLE STUDY Result Date: 07/13/2024    ECHOCARDIOGRAM REPORT   Patient Name:   LASHELL MOFFITT Date of Exam: 07/13/2024 Medical Rec #:  [id number]          Height:  63.0 in Accession #:    [id number]         Weight:       109.1 lb Date of Birth:  06-Sep-1931          BSA:          1.495 m Patient Age:    89 years           BP:           121/73 mmHg Patient Gender: F                  HR:           91 bpm. Exam Location:  Inpatient Procedure: 2D Echo (Both Spectral and Color Flow Doppler were utilized during            procedure). Indications:     stroke  History:         Patient has no prior history of Echocardiogram examinations.                  Parkinsons, Arrythmias:Atrial Fibrillation; Risk                  Factors:Hypertension and Dyslipidemia.  Sonographer:     Tinnie Barefoot RDCS Referring Phys:  [id number] ELIDA HERO STACK Diagnosing Phys: Deatrice Cage MD IMPRESSIONS  1. Left ventricular ejection fraction, by estimation, is 50 to 55%. The left ventricle has low normal function. The left ventricle has no regional wall motion abnormalities. Left ventricular diastolic parameters are indeterminate.  2. Right ventricular systolic  function is mildly reduced. The right ventricular size is mildly enlarged. Moderately increased right ventricular wall thickness. There is moderately elevated pulmonary artery systolic pressure. The estimated right ventricular systolic pressure is 55.6 mmHg.  3. Left atrial size was mildly dilated.  4. Right atrial size was severely dilated.  5. The mitral valve is normal in structure. Mild mitral valve regurgitation. No evidence of mitral stenosis.  6. Tricuspid valve regurgitation is moderate to severe.  7. The aortic valve is normal in structure. Aortic valve regurgitation is mild. Aortic valve sclerosis/calcification is present, without any evidence of aortic stenosis.  8. Moderately dilated pulmonary artery.  9. The inferior vena cava is dilated in size with >50% respiratory variability, suggesting right atrial pressure of 8 mmHg. 10. Agitated saline contrast bubble study was negative, with no evidence of any interatrial shunt. FINDINGS  Left Ventricle: Left ventricular ejection fraction, by estimation, is 50 to 55%. The left ventricle has low normal function. The left ventricle has no regional wall motion abnormalities. The left ventricular internal cavity size was normal in size. There is no left ventricular hypertrophy. Left ventricular diastolic parameters are indeterminate. Right Ventricle: The right ventricular size is mildly enlarged. Moderately increased right ventricular wall thickness. Right ventricular systolic function is mildly reduced. There is moderately elevated pulmonary artery systolic pressure. The tricuspid regurgitant velocity is 3.45 m/s, and with an assumed right atrial pressure of 8 mmHg, the estimated right ventricular systolic pressure is 55.6 mmHg. Left Atrium: Left atrial size was mildly dilated. Right Atrium: Right atrial size was severely dilated. Pericardium: There is no evidence of pericardial effusion. Mitral Valve: The mitral valve is normal in structure. Mild mitral valve  regurgitation. No evidence of mitral valve stenosis. Tricuspid Valve: The tricuspid valve is normal in structure. Tricuspid valve regurgitation is moderate to severe. No evidence of tricuspid stenosis. Aortic Valve: The aortic valve is normal in structure. Aortic valve regurgitation is mild.  Aortic regurgitation PHT measures 446 msec. Aortic valve sclerosis/calcification is present, without any evidence of aortic stenosis. Pulmonic Valve: The pulmonic valve was normal in structure. Pulmonic valve regurgitation is trivial. No evidence of pulmonic stenosis. Aorta: The aortic root is normal in size and structure. Pulmonary Artery: The pulmonary artery is moderately dilated. Venous: The inferior vena cava is dilated in size with greater than 50% respiratory variability, suggesting right atrial pressure of 8 mmHg. IAS/Shunts: No atrial level shunt detected by color flow Doppler. Agitated saline contrast was given intravenously to evaluate for intracardiac shunting. Agitated saline contrast bubble study was negative, with no evidence of any interatrial shunt.  LEFT VENTRICLE PLAX 2D LVIDd:         3.50 cm LVIDs:         2.60 cm LV PW:         1.00 cm LV IVS:        0.90 cm LVOT diam:     1.90 cm LV SV:         30 LV SV Index:   20 LVOT Area:     2.84 cm LV IVRT:       60 msec  LV Volumes (MOD) LV vol d, MOD A2C: 40.6 ml LV vol d, MOD A4C: 42.8 ml LV vol s, MOD A2C: 20.2 ml LV vol s, MOD A4C: 21.3 ml LV SV MOD A2C:     20.4 ml LV SV MOD A4C:     42.8 ml LV SV MOD BP:      22.9 ml RIGHT VENTRICLE             IVC RV Basal diam:  2.80 cm     IVC diam: 1.90 cm RV S prime:     12.40 cm/s TAPSE (M-mode): 1.4 cm LEFT ATRIUM             Index        RIGHT ATRIUM           Index LA diam:        3.70 cm 2.48 cm/m   RA Area:     26.30 cm LA Vol (A2C):   68.0 ml 45.50 ml/m  RA Volume:   79.60 ml  53.26 ml/m LA Vol (A4C):   61.5 ml 41.15 ml/m LA Biplane Vol: 69.2 ml 46.30 ml/m  AORTIC VALVE LVOT Vmax:   66.67 cm/s LVOT Vmean:   44.700 cm/s LVOT VTI:    0.107 m AI PHT:      446 msec  AORTA Ao Root diam: 3.00 cm Ao Asc diam:  3.00 cm TRICUSPID VALVE TR Peak grad:   47.6 mmHg TR Vmax:        345.00 cm/s  SHUNTS Systemic VTI:  0.11 m Systemic Diam: 1.90 cm Deatrice Cage MD Electronically signed by Deatrice Cage MD Signature Date/Time: 07/13/2024/10:49:54 AM    Final    DG Abd 1 View Result Date: 07/12/2024 EXAM: 1 VIEW XRAY OF THE ABDOMEN 07/12/2024 03:30:37 PM COMPARISON: None available. CLINICAL HISTORY: Abdominal pain, acute. FINDINGS: BOWEL: Nonobstructive bowel gas pattern. Gas in left hemicolon. Moderate stool in distal colon. SOFT TISSUES: Vascular calcifications. BONES: No acute fracture. Thoracolumbar fusion hardware. Partially imaged right hip arthroplasty. CHEST BASE: Cardiomegaly. IMPRESSION: 1. Nonobstructive bowel gas pattern with moderate distal colonic stool burden. 2. Cardiomegaly and vascular calcifications. Electronically signed by: Greig Pique MD 07/12/2024 04:14 PM EST RP Workstation: [id number]   MR BRAIN WO CONTRAST Result Date: 07/12/2024 EXAM: MRI BRAIN WITHOUT CONTRAST  07/12/2024 11:53:20 AM TECHNIQUE: Multiplanar multisequence MRI of the head/brain was performed without the administration of intravenous contrast. COMPARISON: CT angiogram of the head and neck dated 07/12/2024. CLINICAL HISTORY: Neuro deficit, acute, stroke suspected. FINDINGS: BRAIN AND VENTRICLES: Acute non hemorrhagic infarction in the left precentral gyrus. There is age-related atrophy and mild-to-moderate cerebral white matter disease. There are chronic lacunar infarcts again demonstrated within the internal capsules bilaterally. No other acute intracranial hemorrhage. No mass. No midline shift. No hydrocephalus. The sella is unremarkable. Normal flow voids. ORBITS: The patient is status post bilateral lens replacement. SINUSES AND MASTOIDS: There is moderate mucosal disease within the paranasal sinuses. BONES AND SOFT TISSUES: Normal  marrow signal. No soft tissue abnormality. IMPRESSION: 1. Restricted diffusion within the cortex of the left precentral gyrus compatible with acute non hemorrhagic infarction. 2. Age-related atrophy and mild-to-moderate cerebral white matter disease. 3. Chronic lacunar infarcts within the internal capsules bilaterally. 4. Moderate paranasal sinus mucosal disease. Electronically signed by: Evalene Coho MD 07/12/2024 12:03 PM EST RP Workstation: [id number]   CT ANGIO HEAD NECK W WO CM Result Date: 07/12/2024 EXAM: CTA HEAD AND NECK  WITH 07/12/2024 09:12:00 AM TECHNIQUE: CTA of the head and neck was performed with the administration of 75 mL of iohexol  (OMNIPAQUE ) 350 MG/ML injection. Multiplanar 2D and/or 3D reformatted images are provided for review. Automated exposure control, iterative reconstruction, and/or weight based adjustment of the mA/kV was utilized to reduce the radiation dose to as low as reasonably achievable. Stenosis of the internal carotid arteries measured using NASCET criteria. COMPARISON: None available CLINICAL HISTORY: Neuro deficit, acute, stroke suspected; R facial droop L sided extremity weakness. FINDINGS: CTA NECK: AORTIC ARCH AND ARCH VESSELS: No dissection or arterial injury. No significant stenosis of the brachiocephalic or subclavian arteries. CERVICAL CAROTID ARTERIES: The common carotid and internal carotid arteries are tortuous. The right common carotid artery has a retropharyngeal bend, which displaces the esophagus to the left. There is mild calcific atheromatous disease within the carotid bulbs, but no flow-limiting stenosis. No dissection or arterial injury. CERVICAL VERTEBRAL ARTERIES: The vertebral arteries are codominant. No dissection, arterial injury, or significant stenosis. LUNGS AND MEDIASTINUM: Unremarkable. SOFT TISSUES: No acute abnormality. BONES: No acute abnormality. CTA HEAD: ANTERIOR CIRCULATION: There is mild calcific plaque within the carotid siphons with  no flow-limiting stenosis. No significant stenosis of the anterior cerebral arteries. No significant stenosis of the middle cerebral arteries. No aneurysm. POSTERIOR CIRCULATION: No significant stenosis of the posterior cerebral arteries. No significant stenosis of the basilar artery. No significant stenosis of the vertebral arteries. No aneurysm. OTHER: There is paranasal sinus disease present. No dural venous sinus thrombosis on this non-dedicated study. The above findings were communicated to Doctor Matthews at 9:19 AM on 07/12/2024. IMPRESSION: 1. No large vessel occlusion, hemodynamically significant stenosis, or aneurysm in the head or neck. 2. Mild calcific atheromatous disease within the carotid bulbs and carotid siphons without flow-limiting stenosis. 3. Tortuous common carotid and internal carotid arteries, with the right common carotid artery having a retropharyngeal bend displacing the esophagus to the left. 4. Findings communicated to Dr. Matthews at 9:19 AM on 07/12/2024. Electronically signed by: Evalene Coho MD 07/12/2024 09:23 AM EST RP Workstation: [id number]   CT HEAD CODE STROKE WO CONTRAST Result Date: 07/12/2024 EXAM: CT HEAD WITHOUT CONTRAST 07/12/2024 08:55:00 AM TECHNIQUE: CT of the head was performed without the administration of intravenous contrast. Automated exposure control, iterative reconstruction, and/or weight based adjustment of the mA/kV was utilized to reduce the radiation dose  to as low as reasonably achievable. COMPARISON: None available. CLINICAL HISTORY: Neuro deficit, acute, stroke suspected. FINDINGS: BRAIN AND VENTRICLES: No acute hemorrhage. No evidence of acute infarct. No hydrocephalus. No extra-axial collection. No mass effect or midline shift. There is age-related volume loss. There are chronic lacunar infarcts within the internal capsules bilaterally. ORBITS: No acute abnormality. The patient is status post bilateral lens replacement. SINUSES: There is an air fluid  level within the right maxillary sinus and the left sphenoid sinus. There is moderate opacification of the ethmoid and sphenoid sinuses and the floor of the right frontal sinus. SOFT TISSUES AND SKULL: No acute soft tissue abnormality. No skull fracture. Findings were communicated to Doctor Matthews at 08:58 AM 07/12/2024. [place] Stroke Program Early CT Score (ASPECTS) ----- Ganglionic (caudate, IC, lentiform nucleus, insula, M1-M3): 7 Supraganglionic (M4-M6): 3 Total: 10 IMPRESSION: 1. No acute intracranial abnormality to suggest acute infarct or hemorrhage in the setting of suspected acute stroke. 2. Age-related volume loss and chronic lacunar infarcts within the internal capsules bilaterally. 3. Moderate paranasal sinus disease with air-fluid levels in the right maxillary and left sphenoid sinuses, which can be seen with acute sinusitis. 4. These findings were communicated to Dr. Matthews at 08:58 AM on 07/12/2024. ASPECTS: 10. Electronically signed by: Evalene Coho MD 07/12/2024 09:00 AM EST RP Workstation: [id number]    Medications: I have reviewed the patient's current medications. Scheduled:   stroke: early stages of recovery book   Does not apply Once   aspirin  EC  81 mg Oral Daily   brimonidine   1 drop Right Eye BID   carbidopa -levodopa   2 tablet Oral TID   diltiazem   180 mg Oral QHS   dorzolamide -timolol   1 drop Both Eyes BID   latanoprost   1 drop Both Eyes QHS   [START ON 07/14/2024] levothyroxine   25 mcg Oral Once per day on Tuesday Thursday   levothyroxine   50 mcg Oral Once per day on Sunday Monday Wednesday Friday Saturday   multivitamin  1 tablet Oral Daily   pramipexole   0.5 mg Oral QHS   prednisoLONE  acetate  1 drop Both Eyes Daily   primidone   50 mg Oral BID   sodium chloride  flush  3 mL Intravenous Q12H    Assessment/Plan:  89 y.o. female with hx of HTN, HL, a fib on therapeutic lovenox , CHF hypothyroidism with right sided weakness.  MRI personally reviewed and significant for  left precentral gyrus acute infarct likely embolic in etiology.  CTA of the head and neck personally reviewed and reveals no evidence of LVO.  On ASA.   LDL 75, A1c 5.9.  Echocardiogram with EF of 50-55% and no evidence of PFO.   B12, RPR, TSH, HIV normal  Recommendations: High intensity statin initiation 2.   Permissive HTN x48 hrs from sx onset goal BP <220/110. After that time goal BP<140/80 3.   Hold lovenox  in the setting of acute ischemic stroke. Consider switching to heparin gtt at hospital day #3 if exam is stable or improved. In the meantime would continue ASA 81mg  daily. Please stop aspirin  when heparin is started  4.   Frequent neuro checks 5.   STAT head CT for any change in neuro exam 6.   Telemetry 7.   PT/ OT/ Speech therapy   LOS: 1 day   Sonny Hock, MD Neurology  07/13/2024  6:20 PM

## 2024-07-13 NOTE — Progress Notes (Signed)
 EEG complete. No initial skin breakdown.

## 2024-07-13 NOTE — Evaluation (Signed)
 Speech Language Pathology Evaluation Patient Details Name: Cassandra Glover MRN: [id number] DOB: January 01, 1932 Today's Date: 07/13/2024 Time: 9084-9064 SLP Time Calculation (min) (ACUTE ONLY): 20 min  Problem List:  Patient Active Problem List   Diagnosis Date Noted   GERD (gastroesophageal reflux disease) 07/12/2024   Essential hypertension 07/12/2024   Hyperlipidemia 07/12/2024   Parkinson's disease (HCC) 07/12/2024   Hypothyroidism 07/12/2024   Atrial fibrillation, chronic (HCC) 07/12/2024   Glaucoma 07/12/2024   Osteoporosis 07/12/2024   Cerebral infarction (HCC) 07/12/2024   Past Medical History:  Past Medical History:  Diagnosis Date   Actinic keratosis    Basal cell carcinoma 08/04/2019   left superior helix. Excised 09/01/2019, margins free.   Basal cell carcinoma 08/15/2017   R shoulder posterior    Basal cell carcinoma 01/06/2015   Left distal lateral pretibial,  left medial ankle, right proximal lateral pretibial,   Basal cell carcinoma 11/29/2014   L lateral inferior knee   Basal cell carcinoma 08/26/2014   anterior chin   Basal cell carcinoma 02/25/2014   left forearm and left temple hairline   Basal cell carcinoma 02/03/2013    left post auricular auricular inferior inferior neck and ) right paraspinal mid to upper back / excised    Basal cell carcinoma 09/21/2008   L pretibial 0.8cm  and LEFT MEDIAL SUP CALF, 1.1 X 0.6CM   Basal cell carcinoma 10/04/2009   R distal pretibial    History of basal cell carcinoma (BCC) 07/15/2019   left superior helix and  right dorsum proximal forearm   History of basal cell carcinoma (BCC) 09/23/2007   R SUP MEDIAL CALF ANT and R MID LAT PRETIBIAL   History of basal cell carcinoma (BCC) 05/04/2008   R TRICEP   Past Surgical History: History reviewed. No pertinent surgical history. HPI:  Per H&P, Cassandra Glover is a 89 y.o. year old female with medical history of hypertension, hyperlipidemia, atrial fibrillation, CHF,  hypothyroidism presenting to the ED with facial droop and left-sided weakness. LKW was 6 am per facility. On arrival to the ED patient was noted to be HDS stable.  Lab work and imaging obtained.  CBC with mild leukocytosis and mild thrombocytosis, normal hemoglobin.  CMP with mild hypokalemia at 3.2 and mild hypoalbuminemia.  Troponin mildly elevated but flat.  Code stroke initiated on patient arrival CT head without any acute findings.  CTA with no large vessel occlusion.  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   Assessment / Plan / Recommendation Clinical Impression  Pt seen for cognitive linguistic evaluation in the setting of acte CVA. Assessment consisting of pt/spouse interview and completion of dynamic assessment. Pt presents with significant verbal expressive language deficits, hindering her from any verbalizations despite attempts at conversational/automatic prompts. Pt independently utilizing gesture and facial expression for response. All attempts affirmed to bolster continued use of multimodal communication. Potential to benefit from communication board, though spouse endorsing poor vision at baseline. Question impact of apraxia given noted inconsistent verbal groping when attempting to verbally respond. Auditory comprehension with accuracy for simple, one step commands in context. Comprehension aided by extended time and visual cues. Direct cognitive assessment limited in the setting of significant language deficits, though suspect impaired attention, with benefit of intermittent redirection to task. Education shared with spouse to reinforce all gesture response, engage with pt, provide calm environment, give clear instructions, and allow time for rest. SLP will continue to follow to address current cognitive communication  impairment- suspect pt to benefit from continued SLP intervention at discharge.       SLP Assessment  SLP  Recommendation/Assessment: Patient needs continued Speech Language Pathology Services SLP Visit Diagnosis: Dysphagia, unspecified (R13.10);Aphasia (R47.01);Cognitive communication deficit (R41.841) (further assessment for apraxia)     Assistance Recommended at Discharge  Frequent or constant Supervision/Assistance  Functional Status Assessment Patient has had a recent decline in their functional status and/or demonstrates limited ability to make significant improvements in function in a reasonable and predictable amount of time  Frequency and Duration min 2x/week  2 weeks      SLP Evaluation Cognition  Overall Cognitive Status: Impaired/Different from baseline Arousal/Alertness: Awake/alert Orientation Level: Disoriented X4 Attention: Sustained Sustained Attention: Impaired Sustained Attention Impairment: Verbal basic;Functional basic Memory:  (difficult to assess in the setting of langugae deficits) Awareness:  (difficult to assess in the setting of langugae deficits) Problem Solving:  (difficult to assess in the setting of langugae deficits) Safety/Judgment: Other (comment) (difficult to assess in the setting of langugae deficits)       Comprehension  Auditory Comprehension Overall Auditory Comprehension: Impaired Yes/No Questions: Not tested Commands: Within Functional Limits (one step commands- basic, with extended time.) Interfering Components: Attention;Hearing;Motor planning EffectiveTechniques: Visual/Gestural cues;Extra processing time Visual Recognition/Discrimination Discrimination: Not tested Reading Comprehension Reading Status: Not tested    Expression Expression Primary Mode of Expression: Nonverbal - gestures Verbal Expression Overall Verbal Expression: Impaired Initiation: Impaired Automatic Speech:  (no verbalizations) Repetition: Impaired Level of Impairment: Word level Effective Techniques:  (verification of gesture response) Non-Verbal Means of  Communication: Gestures Written Expression Written Expression: Not tested   Oral / Motor  Oral Motor/Sensory Function Overall Oral Motor/Sensory Function: Moderate impairment Facial ROM: Reduced left Facial Symmetry: Abnormal symmetry left Facial Strength: Reduced left Lingual ROM:  (limited assessment- reduced protrusion and lateralization) Lingual Symmetry: Within Functional Limits Lingual Strength: Reduced Velum: Within Functional Limits Mandible: Within Functional Limits Motor Speech Overall Motor Speech:  (needs further assessment- question if verbal apraxia contributing to verbal expressive deficits)           Kristien Salatino Clapp, MS, CCC-SLP Speech Language Pathologist Rehab Services; [facility] [phone] (ascom)   Alassane Kalafut J Clapp 07/13/2024, 11:59 AM

## 2024-07-13 NOTE — Evaluation (Signed)
 Occupational Therapy Evaluation Patient Details Name: Cassandra Glover MRN: [id number] DOB: [birth year] Today's Date: 07/13/2024   History of Present Illness   Pt is a 89 y.o. 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 PMH significant for HTN, HLD, AFIB, CHF, basal cell carcinoma, Parkinsons, rectal prolapse     Clinical Impressions Pt admitted with above. Pt non-verbal, lethargic and follows some 1-step commands. Spouse provides insight into PLOF. Pt mod independent for BADLs and transfers ~3 wks ago, able to propel power wheelchair around home. Pt most recently at [facility] for rehab and was requiring +1-2 for transfers, dependant assist for ADLs. Attempted bed mobility with +1 MAX-TOTAL assist, pt resistive and +2 called in for safety. Pt able to transition to seated EOB with MAX-TOTAL +2, L lateral and posterior lean in static sitting fluctuating between MIN-MAX A. Anticipate pt will require TOTAL A for bed level ADLs, will place pt on trial of OT services to see if pt able to participate and progress in functional tasks. Pt would benefit from skilled OT services to address noted impairments and functional limitations (see below for any additional details) in order to maximize safety and independence while minimizing falls risk and caregiver burden. Anticipate the need for follow up OT services upon acute hospital DC.      If plan is discharge home, recommend the following:   Two people to help with walking and/or transfers;Two people to help with bathing/dressing/bathroom     Functional Status Assessment   Patient has had a recent decline in their functional status and demonstrates the ability to make significant improvements in function in a reasonable and predictable amount of time.     Equipment Recommendations   None recommended by OT      Precautions/Restrictions   Precautions Precautions: Fall Recall of  Precautions/Restrictions: Impaired Precaution/Restrictions Comments: pt non-verbal since recent CVA Restrictions Weight Bearing Restrictions Per Provider Order: No     Mobility Bed Mobility Overal bed mobility: Needs Assistance Bed Mobility: Rolling, Sidelying to Sit, Sit to Supine Rolling: Max assist, +2 for physical assistance, +2 for safety/equipment Sidelying to sit: Max assist, +2 for safety/equipment, +2 for physical assistance   Sit to supine: Max assist, +2 for physical assistance, +2 for safety/equipment   General bed mobility comments: attempted bed mobility with +1; pt total assist, resisting, called in +2, pt puts forth some effort when cued extensively, ultimately +2 for transitoining from supine to seated    Transfers Overall transfer level: Needs assistance                 General transfer comment: NT, unsafe, pt with poor static sitting balance      Balance Overall balance assessment: Needs assistance Sitting-balance support: Feet supported, Bilateral upper extremity supported Sitting balance-Leahy Scale: Poor Sitting balance - Comments: posterior and L lateral lean present Postural control: Posterior lean, Left lateral lean     Standing balance comment: unable to attempt transfers                           ADL either performed or assessed with clinical judgement   ADL Overall ADL's : Needs assistance/impaired Eating/Feeding: NPO                                   Functional mobility during ADLs: Total assistance;+2 for physical assistance  General ADL Comments: anticipate TOTAL A for bed level ADLs      Pertinent Vitals/Pain Pain Assessment Pain Assessment: PAINAD Breathing: occasional labored breathing, short period of hyperventilation Negative Vocalization: occasional moan/groan, low speech, negative/disapproving quality Facial Expression: smiling or inexpressive Body Language: tense, distressed pacing,  fidgeting Consolability: no need to console PAINAD Score: 3 Pain Intervention(s): Repositioned     Extremity/Trunk Assessment Upper Extremity Assessment Upper Extremity Assessment: Defer to OT evaluation (p)   Lower Extremity Assessment Lower Extremity Assessment: Difficult to assess due to impaired cognition;Generalized weakness   Cervical / Trunk Assessment Cervical / Trunk Assessment: Kyphotic   Communication Communication Communication: Impaired Factors Affecting Communication: Difficulty expressing self;Reduced clarity of speech;Other (comment) (non-verbal)   Cognition Arousal: Lethargic Behavior During Therapy: Flat affect Cognition: Difficult to assess Difficult to assess due to: Level of arousal, Impaired communication                             Following commands: Impaired Following commands impaired: Follows one step commands inconsistently     Cueing  General Comments   Cueing Techniques: Gestural cues;Tactile cues;Verbal cues              Home Living Family/patient expects to be discharged to:: Private residence Living Arrangements: Spouse/significant other Available Help at Discharge: Family;Available PRN/intermittently Type of Home: House Home Access: Ramped entrance     Home Layout: One level     Bathroom Shower/Tub: Sponge bathes at baseline         Home Equipment: Wheelchair - [employer] (2 wheels);Rollator (4 wheels)   Additional Comments: pt most recently at [facility] for STR, dependent assist for ADLs, +1-2 for transfers.  Lives With: Spouse    Prior Functioning/Environment Prior Level of Function : Needs assist       Physical Assist : Mobility (physical);ADLs (physical) Mobility (physical): Bed mobility;Transfers ADLs (physical): Feeding;Grooming;Bathing;Dressing;Toileting;IADLs Mobility Comments: transferring at [facility] with +1-2 assist to wheelchair, non-ambulatory at baseline for > 1 year, uses power  wheelchair ADLs Comments: dependent assist for ADLs at [facility], ~3 wks ago pt was mod independent for ADLs/transfers per spouse    OT Problem List: Decreased strength;Decreased range of motion;Decreased activity tolerance;Impaired balance (sitting and/or standing);Decreased cognition;Impaired UE functional use;Decreased knowledge of precautions;Decreased knowledge of use of DME or AE   OT Treatment/Interventions: Self-care/ADL training;Neuromuscular education;DME and/or AE instruction;Therapeutic activities;Patient/family education;Balance training      OT Goals(Current goals can be found in the care plan section)   Acute Rehab OT Goals OT Goal Formulation: With family Time For Goal Achievement: 07/27/24 Potential to Achieve Goals: Fair   OT Frequency:  Min 2X/week    Co-evaluation   Reason for Co-Treatment: Complexity of the patient's impairments (multi-system involvement);For patient/therapist safety PT goals addressed during session: Mobility/safety with mobility;Balance;Strengthening/ROM        AM-PAC OT 6 Clicks Daily Activity     Outcome Measure Help from another person eating meals?: Total (NPO) Help from another person taking care of personal grooming?: Total Help from another person toileting, which includes using toliet, bedpan, or urinal?: Total Help from another person bathing (including washing, rinsing, drying)?: Total Help from another person to put on and taking off regular upper body clothing?: Total Help from another person to put on and taking off regular lower body clothing?: Total 6 Click Score: 6   End of Session Nurse Communication: Mobility status  Activity Tolerance: Patient tolerated treatment well Patient left: in bed;with call  bell/phone within reach;Other (comment);with family/visitor present;with nursing/sitter in room (pt left with ECHO team in room)  OT Visit Diagnosis: Muscle weakness (generalized) (M62.81);Other abnormalities of gait and  mobility (R26.89);Unsteadiness on feet (R26.81);Cognitive communication deficit (R41.841);Other symptoms and signs involving cognitive function;Other symptoms and signs involving the nervous system (R29.898);Adult, failure to thrive (R62.7);Hemiplegia and hemiparesis Symptoms and signs involving cognitive functions: Cerebral infarction Hemiplegia - caused by: Cerebral infarction                Time: 0935-1001 OT Time Calculation (min): 26 min Charges:  OT General Charges $OT Visit: 1 Visit OT Evaluation $OT Eval Moderate Complexity: 1 Mod  Kaylon Hitz L. Rosa Gambale, OTR/L  07/13/24, 12:30 PM

## 2024-07-13 NOTE — Progress Notes (Signed)
" °  Echocardiogram 2D Echocardiogram has been performed.  Rolando Whitby 07/13/2024, 10:42 AM "

## 2024-07-14 DIAGNOSIS — Z515 Encounter for palliative care: Secondary | ICD-10-CM | POA: Diagnosis not present

## 2024-07-14 DIAGNOSIS — I639 Cerebral infarction, unspecified: Secondary | ICD-10-CM | POA: Diagnosis not present

## 2024-07-14 DIAGNOSIS — R569 Unspecified convulsions: Secondary | ICD-10-CM

## 2024-07-14 DIAGNOSIS — I63412 Cerebral infarction due to embolism of left middle cerebral artery: Secondary | ICD-10-CM | POA: Diagnosis not present

## 2024-07-14 DIAGNOSIS — Z7189 Other specified counseling: Secondary | ICD-10-CM | POA: Diagnosis not present

## 2024-07-14 LAB — GLUCOSE, CAPILLARY: Glucose-Capillary: 99 mg/dL (ref 70–99)

## 2024-07-14 NOTE — Plan of Care (Signed)
" °  Problem: Education: Goal: Knowledge of disease or condition will improve 07/14/2024 1708 by Jenita Fleeting, RN Outcome: Progressing 07/14/2024 1708 by Jenita Fleeting, RN Outcome: Progressing Goal: Knowledge of secondary prevention will improve (MUST DOCUMENT ALL) 07/14/2024 1708 by Jenita Fleeting, RN Outcome: Progressing 07/14/2024 1708 by Jenita Fleeting, RN Outcome: Progressing Goal: Knowledge of patient specific risk factors will improve (DELETE if not current risk factor) 07/14/2024 1708 by Jenita Fleeting, RN Outcome: Progressing 07/14/2024 1708 by Jenita Fleeting, RN Outcome: Progressing   Problem: Education: Goal: Knowledge of secondary prevention will improve (MUST DOCUMENT ALL) 07/14/2024 1708 by Jenita Fleeting, RN Outcome: Progressing 07/14/2024 1708 by Jenita Fleeting, RN Outcome: Progressing   Problem: Education: Goal: Knowledge of patient specific risk factors will improve (DELETE if not current risk factor) 07/14/2024 1708 by Jenita Fleeting, RN Outcome: Progressing 07/14/2024 1708 by Jenita Fleeting, RN Outcome: Progressing   Problem: Ischemic Stroke/TIA Tissue Perfusion: Goal: Complications of ischemic stroke/TIA will be minimized 07/14/2024 1708 by Jenita Fleeting, RN Outcome: Progressing 07/14/2024 1708 by Jenita Fleeting, RN Outcome: Progressing   "

## 2024-07-14 NOTE — Procedures (Signed)
 Patient Name: Cassandra Glover  MRN: [id number]  Epilepsy Attending: Arlin MALVA Krebs  Referring Physician/Provider: Matthews Elida HERO, MD Date: 07/13/2024 Duration: 21.02 mins  Patient history:  89 y.o. female with hx of HTN, HL, a fib on therapeutic lovenox , CHF hypothyroidism presenting to ED with L facial droop and LUE and LLE weakness. EEG to evaluate for seizure.  Level of alertness: Awake  AEDs during EEG study: None  Technical aspects: This EEG study was done with scalp electrodes positioned according to the 10-20 International system of electrode placement. Electrical activity was reviewed with band pass filter of 1-70Hz , sensitivity of 7 uV/mm, display speed of 45mm/sec with a 60Hz  notched filter applied as appropriate. EEG data were recorded continuously and digitally stored.  Video monitoring was available and reviewed as appropriate.  Description: The posterior dominant rhythm consists of 8 Hz activity of moderate voltage (25-35 uV) seen predominantly in posterior head regions, symmetric and reactive to eye opening and eye closing. EEG showed intermittent generalized 3 to 6 Hz theta-delta slowing. Physiologic photic driving was seen during photic stimulation.  Hyperventilation was not performed.     ABNORMALITY - Intermittent slow, generalized  IMPRESSION: This study is suggestive of mild diffuse encephalopathy. No seizures or epileptiform discharges were seen throughout the recording.  Yareth Kearse O Matraca Hunkins

## 2024-07-14 NOTE — Consult Note (Signed)
 " Consultation Note Date: 07/14/2024   Patient Name: Cassandra Glover  DOB: 10-Nov-1931  MRN: [id number]  Age / Sex: 89 y.o., female   PCP: Jyl Railing, MD Referring Physician: Lanetta Lingo, MD  Reason for Consultation: Establishing goals of care     Chief Complaint/History of Present Illness:  Palliative care consult received.  Chart reviewed including personal review of pertinent labs and imaging.  Reviewed most recent notes from hospitalist, speech, neurology.  I met today with Ms. Rubert and her husband at the bedside.  She was intermittently awake throughout conversation with her husband.  She has dysarthria and trouble speaking but did smile and nod a couple of times to certain questions.  She did not reliably follow commands during examination.  I introduced palliative care as specialized medical care for people living with serious illness. It focuses on providing relief from the symptoms and stress of a serious illness. The goal is to improve quality of life for both the patient and the family.  We discussed clinical course both this hospitalization and prior to this hospitalization when she was seen in [place].  We talked about the changes that her husband has noted in her nutrition, cognition, and functional status.  He reports that he has seen he is declining prior to the first hospitalization but has seen progressive changes in these since she started to become ill.  States that prior to that hospitalization at the beginning of January she was ambulatory utilizing motorized wheelchair and transferring herself to and from wheelchair to bathroom as needed.  We specifically talked about concerned about her nutrition.  She was seen by speech therapy and advance to pured diet today.  Talked about continuing to monitor intake over the next day or so and wanted to see if it is likely that she is going to be able to maintain her own nutrition and hydration.  He expressed that they  had discussed before in the past and she would not be interested in any sort of long-term artificial nutrition and hydration.  With this wish in mind, we discussed difference between a aggressive medical intervention path and a palliative, comfort focused care path.  Values and goals of care important to patient and family were attempted to be elicited.  We discussed that the hospital can be useful as long as she is getting well enough from care she receives at the hospital to enjoy time at home, but there is going to come a time in the near future where, if the goal is to be at home, she may be better served to plan on being at home and bringing care to her at home rather repeated trips to the hospital. We discussed hospice as a tool that may be beneficial in this goal when she reaches a point where we are trying to fix problems that are not fixable.  Her husband is in agreement that a good plan would be to see how her nutrition progresses over the next 24 hours or so.    If she is able to take in enough to maintain herself, we discussed plan to transition to rehab for continued physical therapy. She has done well with rehabbing in the past. If she does well at rehab and continues to thrive, I encouraged they continue with this plan. If, however she is unable maintain her nutrition, we discussed that time would be short and I recommended we discuss further regarding transitioning home with support of organization such as hospice.  Concept of Hospice and Palliative Care were discussed   Questions and concerns addressed.   PMT will continue to support holistically.  Primary Diagnoses  Present on Admission:  Cerebral infarction [facility])   Palliative Review of Systems: Patient minimally interactive.  Does not endorse complaints on ROS.  Past Medical History:  Diagnosis Date   Actinic keratosis    Basal cell carcinoma 08/04/2019   left superior helix. Excised 09/01/2019, margins free.   Basal cell  carcinoma 08/15/2017   R shoulder posterior    Basal cell carcinoma 01/06/2015   Left distal lateral pretibial,  left medial ankle, right proximal lateral pretibial,   Basal cell carcinoma 11/29/2014   L lateral inferior knee   Basal cell carcinoma 08/26/2014   anterior chin   Basal cell carcinoma 02/25/2014   left forearm and left temple hairline   Basal cell carcinoma 02/03/2013    left post auricular auricular inferior inferior neck and ) right paraspinal mid to upper back / excised    Basal cell carcinoma 09/21/2008   L pretibial 0.8cm  and LEFT MEDIAL SUP CALF, 1.1 X 0.6CM   Basal cell carcinoma 10/04/2009   R distal pretibial    History of basal cell carcinoma (BCC) 07/15/2019   left superior helix and  right dorsum proximal forearm   History of basal cell carcinoma (BCC) 09/23/2007   R SUP MEDIAL CALF ANT and R MID LAT PRETIBIAL   History of basal cell carcinoma (BCC) 05/04/2008   R TRICEP   Social History   Socioeconomic History   Marital status: Married    Spouse name: Not on file   Number of children: Not on file   Years of education: Not on file   Highest education level: Not on file  Occupational History   Not on file  Tobacco Use   Smoking status: Unknown   Smokeless tobacco: Not on file  Substance and Sexual Activity   Alcohol use: Not on file   Drug use: Not on file   Sexual activity: Not on file  Other Topics Concern   Not on file  Social History Narrative   Not on file   Social Drivers of Health   Tobacco Use: Low Risk  (05/07/2024)   [facility] from [facility]   Patient History    Smoking Tobacco Use: Never    Smokeless Tobacco Use: Never    Passive Exposure: Not on file  Financial Resource Strain: Low Risk  (06/24/2024)   Received from [facility]   Overall Financial Resource Strain (CARDIA)    Difficulty of Paying Living Expenses: Not hard at all  Food Insecurity: No Food Insecurity (06/24/2024)    Received from [facility]   Epic    Within the past 12 months, you worried that your food would run out before you got the money to buy more.: Never true    Within the past 12 months, the food you bought just didn't last and you didn't have money to get more.: Never true  Transportation Needs: No Transportation Needs (06/24/2024)   Received from [facility] - Transportation    In the past 12 months, has lack of transportation kept you from medical appointments or from getting medications?: No    Lack of Transportation (Non-Medical): No  Physical Activity: Not on file  Stress: Not on file  Social Connections: Not on file  Depression (EYV7-0): Not on file  Alcohol Screen: Not  on file  Housing: Low Risk  (06/24/2024)   Received from [facility]   Epic    In the last 12 months, was there a time when you were not able to pay the mortgage or rent on time?: No    In the past 12 months, how many times have you moved where you were living?: 0    At any time in the past 12 months, were you homeless or living in a shelter (including now)?: No  Utilities: Not At Risk (06/24/2024)   Received from [facility]   Epic    In the past 12 months has the electric, gas, oil, or water company threatened to shut off services in your home?: No  Health Literacy: Not on file   History reviewed. No pertinent family history. Scheduled Meds:   stroke: early stages of recovery book   Does not apply Once   aspirin  EC  81 mg Oral Daily   brimonidine   1 drop Right Eye BID   carbidopa -levodopa   2 tablet Oral TID   diltiazem   180 mg Oral QHS   dorzolamide -timolol   1 drop Both Eyes BID   latanoprost   1 drop Both Eyes QHS   levothyroxine   25 mcg Oral Once per day on Tuesday Thursday   levothyroxine   50 mcg Oral Once per day on Sunday Monday Wednesday Friday Saturday   multivitamin  1 tablet Oral Daily   pramipexole   0.5 mg Oral QHS    prednisoLONE  acetate  1 drop Both Eyes Daily   primidone   50 mg Oral BID   sodium chloride  flush  3 mL Intravenous Q12H   Continuous Infusions:  diltiazem  (CARDIZEM ) infusion Stopped (07/13/24 1902)   PRN Meds:.acetaminophen  **OR** acetaminophen , ondansetron  **OR** ondansetron  (ZOFRAN ) IV, senna-docusate Allergies[1] CBC:    Component Value Date/Time   WBC 11.9 (H) 07/13/2024 0436   HGB 11.5 (L) 07/13/2024 0436   HCT 35.7 (L) 07/13/2024 0436   PLT 412 (H) 07/13/2024 0436   MCV 93.7 07/13/2024 0436   NEUTROABS 6.9 07/12/2024 0848   LYMPHSABS 2.4 07/12/2024 0848   MONOABS 1.3 (H) 07/12/2024 0848   EOSABS 0.0 07/12/2024 0848   BASOSABS 0.0 07/12/2024 0848   Comprehensive Metabolic Panel:    Component Value Date/Time   NA 134 (L) 07/13/2024 0436   NA 142 07/31/2011 1233   K 4.1 07/13/2024 0436   K 3.8 07/31/2011 1233   CL 101 07/13/2024 0436   CL 102 07/31/2011 1233   CO2 20 (L) 07/13/2024 0436   CO2 29 07/31/2011 1233   BUN 14 07/13/2024 0436   BUN 16 07/31/2011 1233   CREATININE 0.86 07/13/2024 0436   CREATININE 0.77 07/31/2011 1233   GLUCOSE 109 (H) 07/13/2024 0436   GLUCOSE 87 07/31/2011 1233   CALCIUM 8.6 (L) 07/13/2024 0436   CALCIUM 9.6 07/31/2011 1233   AST 24 07/12/2024 0848   ALT 15 07/12/2024 0848   ALKPHOS 92 07/12/2024 0848   BILITOT 1.2 07/12/2024 0848   PROT 7.8 07/12/2024 0848   ALBUMIN 3.4 (L) 07/12/2024 0848    Physical Exam: Vital Signs: BP 103/61   Pulse 85   Temp 97.8 F (36.6 C) (Oral)   Resp (!) 22   Ht 5' 3 (1.6 m)   Wt 50.7 kg   SpO2 98%   BMI 19.80 kg/m  SpO2: SpO2: 98 % O2 Device: O2 Device: Room Air O2 Flow Rate:   Intake/output summary: No intake or output data in the  24 hours ending 07/14/24 1616 LBM: Last BM Date : 07/13/24 Baseline Weight: Weight: 48.1 kg Most recent weight: Weight: 50.7 kg  General: NAD, chronically ill-appearing female HENT: normocephalic, atraumatic, moist mucous membranes Cardiovascular: RRR, no  edema in LE b/l Respiratory: no increased work of breathing noted, not in respiratory distress Neuro: Dysarthria.  Does not reliably follow commands. Psych: Unable to assess          Palliative Performance Scale: 20               Additional Data Reviewed: Recent Labs    07/12/24 0848 07/13/24 0436  WBC 10.7* 11.9*  HGB 12.1 11.5*  PLT 465* 412*  NA 135 134*  BUN 12 14  CREATININE 0.61 0.86    Imaging: EEG adult Shelton Arlin KIDD, MD     07/14/2024 10:59 AM Patient Name: Cassandra Glover  MRN: [id number]  Epilepsy Attending: Arlin KIDD Shelton  Referring Physician/Provider: Matthews Elida HERO, MD Date: 07/13/2024 Duration: 21.02 mins  Patient history:  89 y.o. female with hx of HTN, HL, a fib on  therapeutic lovenox , CHF hypothyroidism presenting to ED with L  facial droop and LUE and LLE weakness.   Level of alertness: Awake  AEDs during EEG study: None  Technical aspects: This EEG study was done with scalp electrodes  positioned according to the 10-20 International system of  electrode placement. Electrical activity was reviewed with band  pass filter of 1-70Hz , sensitivity of 7 uV/mm, display speed of  12mm/sec with a 60Hz  notched filter applied as appropriate. EEG  data were recorded continuously and digitally stored.  Video  monitoring was available and reviewed as appropriate.  Description: The posterior dominant rhythm consists of 8 Hz  activity of moderate voltage (25-35 uV) seen predominantly in  posterior head regions, symmetric and reactive to eye opening and  eye closing. EEG showed intermittent generalized 3 to 6 Hz  theta-delta slowing. Physiologic photic driving was seen during  photic stimulation.  Hyperventilation was not performed.     ABNORMALITY - Intermittent slow, generalized  IMPRESSION: This study is suggestive of mild diffuse encephalopathy. No  seizures or epileptiform discharges were seen throughout the  recording.  Arlin KIDD Shelton      I personally reviewed recent imaging.   Palliative Care Assessment and Plan Summary of Established Goals of Care and Medical Treatment Preferences    # Complex medical decision making/goals of care  -DNR/DNI  - Continue current care.  Husband reports that she would not want a feeding tube at any point in the future.  - Discussed plan to continue following clinical course and progressing conversation based upon continued changes in her nutrition and functional status.  We discussed current plan of transitioning to skilled facility for trial of rehab versus consideration for transition home with hospice support.  He would like to reassess this once it is determined how much intake she is able to maintain as this will largely drive prognosis moving forward.  - Plan for palliative care to continue to follow and progress conversation based upon her clinical course.  -  Code Status: Limited: Do not attempt resuscitation (DNR) -DNR-LIMITED -Do Not Intubate/DNI   Prognosis: Unable to determine  # Psycho-social/Spiritual Support:  - Support System: Husband  # Discharge Planning: To be determined.  At this point appears will be discharged to skilled facility for trial of rehab. Thank you for allowing the palliative care team to participate in the care Elveria LELON Hight.  Tressie Ragin  Oneita, MD Palliative Care Provider PMT # [phone]  If patient remains symptomatic despite maximum doses, please call PMT at [phone] between 0700 and 1900. Outside of these hours, please call attending, as PMT does not have night coverage.   I personally spent a total of 80 minutes in the care of the patient today including preparing to see the patient, getting/reviewing separately obtained history, performing a medically appropriate exam/evaluation, counseling and educating, documenting clinical information in the EHR, and coordinating care.     [1]  Allergies Allergen Reactions   Sulfa Antibiotics  Dermatitis and Hives    Hives  Hives  Hives  sulfamethoxazole   Simvastatin Other (See Comments)    Joint pains   Sulfamethoxazole Rash    Hives   "

## 2024-07-14 NOTE — Plan of Care (Signed)
   Problem: Clinical Measurements: Goal: Ability to maintain clinical measurements within normal limits will improve Outcome: Progressing

## 2024-07-14 NOTE — Care Management Important Message (Signed)
 Important Message  Patient Details  Name: Cassandra Glover MRN: [id number] Date of Birth: 02-11-1932   Important Message Given:  Yes - Medicare IM     Foxx Klarich W, CMA 07/14/2024, 11:55 AM

## 2024-07-14 NOTE — TOC Initial Note (Signed)
 Transition of Care [facility]) - Initial/Assessment Note    Patient Details  Name: Cassandra Glover MRN: [id number] Date of Birth: 01/08/1932  Transition of [facility]) CM/SW Contact:    Shasta DELENA Daring, RN Phone Number: 07/14/2024, 11:06 AM  Clinical Narrative:                 RNCM met with patient and husband in hospital room. Introduced self and explained role. Patient remained asleep.  Husband advised she has been at [facility] and he would like for her to return there when she is discharged. He is unsure how many days she has used. Advised him that after 20 days she will likely have a copay of approximately $200 per day. He said she has a long term care policy and that their daughter has all the info.   Advised I would contact [facility] to see how many days she has used and would work on her transport there when medically stable.     Expected Discharge Plan: Skilled Nursing Facility Barriers to Discharge: Continued Medical Work up   Patient Goals and CMS Choice            Expected Discharge Plan and Services   Discharge Planning Services: CM Consult                                          Prior Living Arrangements/Services     Patient language and need for interpreter reviewed:: Yes        Need for Family Participation in Patient Care: Yes (Comment) Care giver support system in place?: Yes (comment)   Criminal Activity/Legal Involvement Pertinent to Current Situation/Hospitalization: No - Comment as needed  Activities of Daily Living      Permission Sought/Granted Permission sought to share information with : Case Manager, Magazine Features Editor, Family Supports Permission granted to share information with : Yes, Verbal Permission Granted     Permission granted to share info w AGENCY: [facility]        Emotional Assessment Appearance:: Appears stated age Attitude/Demeanor/Rapport: Unable to Assess Affect (typically observed): Unable  to Assess Orientation: :  (patient was asleep) Alcohol / Substance Use: Not Applicable Psych Involvement: No (comment)  Admission diagnosis:  Cerebral infarction [facility]) [I63.9] Acute CVA (cerebrovascular accident) [facility]) [I63.9] Patient Active Problem List   Diagnosis Date Noted   GERD (gastroesophageal reflux disease) 07/12/2024   Essential hypertension 07/12/2024   Hyperlipidemia 07/12/2024   Parkinson's disease (HCC) 07/12/2024   Hypothyroidism 07/12/2024   Atrial fibrillation, chronic (HCC) 07/12/2024   Glaucoma 07/12/2024   Osteoporosis 07/12/2024   Cerebral infarction (HCC) 07/12/2024   PCP:  Jyl Railing, MD Pharmacy:   [facility] [phone] [address], [place] - [address] [place] [address] Phone: [phone] Fax: [phone]     Social Drivers of Health (SDOH) Social History: SDOH Screenings   Food Insecurity: No Food Insecurity (06/24/2024)   Received from [facility]  Housing: Low Risk  (06/24/2024)   Received [facility]  Transportation Needs: No Transportation Needs (06/24/2024)   Received from [facility]  Utilities: Not At Risk (06/24/2024)   Received from [facility]  Financial Resource Strain: Low Risk  (06/24/2024)   Received from [facility]  Tobacco Use: Low Risk  (05/07/2024)   [facility] from [facility]   SDOH Interventions:     Readmission Risk Interventions     No data to display

## 2024-07-14 NOTE — Progress Notes (Signed)
 Physical Therapy Treatment Patient Details Name: Cassandra Glover MRN: [id number] DOB: May 22, 1932 Today's Date: 07/14/2024   History of Present Illness Pt is a 89 y.o. 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 PMH significant for HTN, HLD, AFIB, CHF, basal cell carcinoma, Parkinsons, rectal prolapse    PT Comments  Pt in bed and nod when asked if she wants to sit EOB.  She is dependant to/from supine +1.  She sits EOB about 10 minutes with mostly min/mod a x 1 with short periods of being able to sit unsupported but remains unsafe to be left unattended.  She is able to assist with mouth care when given mouth swab.  Sloughy bloody tissue is on sponge when removed.  She repeats I whiting frequently during session but unable to figure out what she wants.  She is repositioned for comfort in bed with husband in room and safety on.   If plan is discharge home, recommend the following: Two people to help with walking and/or transfers;Two people to help with bathing/dressing/bathroom;Assistance with feeding;Assistance with cooking/housework;Direct supervision/assist for medications management;Direct supervision/assist for financial management;Assist for transportation;Help with stairs or ramp for entrance   Can travel by private vehicle        Equipment Recommendations       Recommendations for Other Services       Precautions / Restrictions Precautions Precautions: Fall Recall of Precautions/Restrictions: Impaired Precaution/Restrictions Comments: pt non-verbal since recent CVA Restrictions Weight Bearing Restrictions Per Provider Order: No     Mobility  Bed Mobility Overal bed mobility: Needs Assistance Bed Mobility: Supine to Sit, Sit to Supine     Supine to sit: Total assist Sit to supine: Total assist   General bed mobility comments: +1 but heavily dependant    Transfers                   General  transfer comment: Deferred attempting due to very poor static sitting balance    Ambulation/Gait                   Stairs             Wheelchair Mobility     Tilt Bed    Modified Rankin (Stroke Patients Only)       Balance Overall balance assessment: Needs assistance Sitting-balance support: Feet supported, Single extremity supported Sitting balance-Leahy Scale: Poor Sitting balance - Comments: able to holf for brief periods but unsafe to be left without hands on assist       Standing balance comment: Unable/unsafe to attempt                            Communication Communication Communication: Impaired Factors Affecting Communication: Difficulty expressing self;Reduced clarity of speech;Other (comment)  Cognition Arousal: Alert Behavior During Therapy: WFL for tasks assessed/performed   PT - Cognitive impairments: Difficult to assess                       PT - Cognition Comments: repeats I whiting frequently during session but unsure of meaning. Following commands: Impaired Following commands impaired: Follows one step commands inconsistently    Cueing Cueing Techniques: Gestural cues, Tactile cues, Verbal cues  Exercises      General Comments        Pertinent Vitals/Pain Pain Assessment Pain Assessment: No/denies pain Pain Location: denied pain when asked but unsure  how accurate answers are    Home Living                          Prior Function            PT Goals (current goals can now be found in the care plan section) Progress towards PT goals: Progressing toward goals    Frequency    Min 1X/week      PT Plan      Co-evaluation              AM-PAC PT 6 Clicks Mobility   Outcome Measure  Help needed turning from your back to your side while in a flat bed without using bedrails?: Total Help needed moving from lying on your back to sitting on the side of a flat bed without using  bedrails?: Total Help needed moving to and from a bed to a chair (including a wheelchair)?: Total Help needed standing up from a chair using your arms (e.g., wheelchair or bedside chair)?: Total Help needed to walk in hospital room?: Total Help needed climbing 3-5 steps with a railing? : Total 6 Click Score: 6    End of Session   Activity Tolerance: Patient limited by fatigue Patient left: in bed;with call bell/phone within reach;with bed alarm set;with family/visitor present   PT Visit Diagnosis: Muscle weakness (generalized) (M62.81);Unsteadiness on feet (R26.81);Adult, failure to thrive (R62.7);Hemiplegia and hemiparesis Hemiplegia - caused by: Cerebral infarction     Time: 8486-8464 PT Time Calculation (min) (ACUTE ONLY): 22 min  Charges:    $Therapeutic Activity: 8-22 mins PT General Charges $$ ACUTE PT VISIT: 1 Visit                   Lauraine Gills, PTA 07/14/24, 3:42 PM

## 2024-07-14 NOTE — Progress Notes (Addendum)
 " Progress Note   Patient: Cassandra Glover FMW:[id number] DOB: 03/20/1932 DOA: 07/12/2024     2 DOS: the patient was seen and examined on 07/14/2024   Brief hospital course:  Cassandra Glover is a 89 y.o. year old female with medical history of hypertension, hyperlipidemia, atrial fibrillation, CHF, hypothyroidism presenting to the ED with facial droop and left-sided weakness. LKW was 6 am per facility. On arrival to the ED patient was noted to be HDS stable.  Lab work and imaging obtained.  CBC with mild leukocytosis and mild thrombocytosis, normal hemoglobin.  CMP with mild hypokalemia at 3.2 and mild hypoalbuminemia.  Troponin mildly elevated but flat.  Code stroke initiated on patient arrival CT head without any acute findings.  CTA with no large vessel occlusion.  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  Patient also noted to be in A-fib with RVR and multiple rounds of diltiazem  given with decrease in heart rate but RVR persistent so diltiazem  gtt. started.  Given need for further care, TRH contacted for admission.   Review of Systems: As mentioned in the history of present illness. All other systems reviewed and are negative.   Assessment and Plan:  Acute CVA [facility] Day 2) Related to patient's known atrial fibrillation Patient with a history of A-fib who presented to the ER for evaluation of left facial droop/Left upper and lower extremity weakness as well as aphasia. Aphasia persists but patient is able to move her upper extremities Was not a candidate for TKA since she was on therapeutic Lovenox  for atrial fibrillation CT head on admission showed no acute intracranial abnormality to suggest acute infarct or hemorrhage in the setting of suspected acute stroke. Age-related volume loss and chronic lacunar infarcts within the internal capsules bilaterally. Moderate paranasal sinus disease with air-fluid levels in the right maxillary and left  sphenoid sinuses, which can be seen with acute sinusitis. CTA of the head and neck showed no large vessel occlusion, hemodynamically significant stenosis, or aneurysm in the head or neck. MRI of the brain showed restricted diffusion within the cortex of the left precentral gyrus compatible with acute non hemorrhagic infarction. Age-related atrophy and mild-to-moderate cerebral white matter disease. Chronic lacunar infarcts within the internal capsules bilaterally. Moderate paranasal sinus mucosal disease. Appreciate neurology input.  Recommends to hold Lovenox  and may start heparin drip on hospital day 3 if exam is stable or improved Continue aspirin .  Discontinue aspirin  once patient is started on a heparin drip Patient is not on a statin due to allergy (myalgia) 2D echocardiogram showed an LVEF of 50 to 55% with normal LV function and no regional wall motion abnormality.  Noted to have moderately elevated pulmonary artery systolic pressure no atrial level shunt detected by color-flow Doppler.  Agitated saline contrast bubble study was negative with no evidence of an interatrial shunt Appreciate speech therapy input, recommends to continue n.p.o. for now,patient may receive medications crushed  01/20:  Awaiting repeat swallow function evaluation.  Patient remains n.p.o.  Rate controlled and off the Cardizem  drip.  EEG showed mild diffuse encephalopathy.  No seizures or epileptiform discharges noted.  Discussed alternative means of nutrition with patient and her husband at the bedside if she failed her swallow evaluation and patient does not want an NG tube placed.     Atrial fibrillation with rapid ventricular rate Patient has been weaned off Cardizem  drip for rate control Continue oral Cardizem  for rate control Currently not on anticoagulation due  to acute stroke     Parkinson's disease Continue Sinemet  and primidone      Hypokalemia Supplemented Magnesium  level within normal limits        Hypothyroidism Continue Synthroid      Adult failure to thrive Overall prognosis is poor in the setting of acute stroke and Parkinson's disease Awaiting palliative care consult           Subjective: Awake.  Husband at the bedside.  Able to follow simple commands  Physical Exam: Vitals:   07/14/24 0200 07/14/24 0500 07/14/24 0636 07/14/24 0730  BP: 121/64 132/64 (!) 101/50 (!) 106/56  Pulse: (!) 104 95 93 85  Resp: 20 20  (!) 22  Temp:  98.2 F (36.8 C)  98.1 F (36.7 C)  TempSrc:    Axillary  SpO2: 93%   96%  Weight:  50.7 kg    Height:       Gen: NAD, chronically ill appearing female.  Has dysarthria HENT: NCAT CV: Irregularly irregular, Lung: CTAB/L Abd: Bowel sounds present, soft, non tender MSK: No asymmetry, good bulk and tone Neuro: alert but dysarthric. Unable to ascertain orientation. CNII-VI intact, strength in BUE 4/5, and BLE 5/5, able to move upper extremities.  Bilateral lower extremity weakness.     Data Reviewed:  There are no new results to review at this time.  Family Communication: Plan of care was discussed with patient's husband at the bedside.  He verbalizes understanding and agrees with the plan.  Awaiting repeat swallow function evaluation. Talk to patient's son and daughter-in-law over the phone.  They understand her overall prognosis is poor and are eager to speak to the palliative care team.  Disposition: Status is: Inpatient  Remains inpatient appropriate because: Awaiting results of repeat swallow function evaluation.  Planned Discharge Destination: Skilled nursing facility    Time spent: 50 minutes  Author: Aimee Somerset, MD 07/14/2024 11:45 AM  For on call review [email or web address].  "

## 2024-07-14 NOTE — Progress Notes (Signed)
 Speech Language Pathology Treatment: Dysphagia  Patient Details Name: Cassandra Glover MRN: [id number] DOB: 01/15/1932 Today's Date: 07/14/2024 Time: 1230-1300 SLP Time Calculation (min) (ACUTE ONLY): 30 min  Assessment / Plan / Recommendation Clinical Impression  Pt seen for ongoing assessment of swallowing and potential readiness for initiation of oral diet. Husband and family present in room w/ pt. Pt awake, alert and attempted verbalizations/approximations intermittently -- suspected Apraxia. She was min slow to respond during po tasks.  Pt indicated w/ head shake no when not ready for another tsp as she was fed by this SLP; time given b/t trials as needed.   Pt appears to present w/ oropharyngeal phase dysphagia w/ sensorimotor deficits. Pt appears at HIGH risk for aspiration currently as well as challenged to meet nutrition/hydration needs.  When following strict aspiration precautions w/ a Dysphagia diet including thickened liquids, pt appeared to adequately tolerate the po trials w/ no immediate, overt clinical s/s of aspiration noted. However, pt has challenging factors that could impact her oropharyngeal swallowing to include suspected impact of new stroke and new deficits; deconditioning; feeding dependency; advanced age. These factors can increase risk for aspiration, dysphagia as well as decreased oral intake overall.   During po trials, pt consumed trial consistencies of Nectar liquids via TSP and Puree w/ no immediate, overt coughing, decline in vocal quality, nor change in respiratory presentation during/post trials. No decline in O2 sats when checked. However, noted soft s/s including multiple swallows and delayed coughing 1x post po trial w/ family; suspect delayed pharyngeal swallowing and potential reduced residue clearing b/t trials. Oral phase appeared min-mod lengthy for bolus management and control of bolus propulsion for A-P transfer for swallowing. Oral clearing achieved  when given Time -- pt indicated w/ head shake no when not ready for another tsp. OM Exam revealed R oral weakness. Full feeding support required.   Recommend a trial initiation of Dysphagia level 1 (puree) w/ well-moistened foods; Nectar liquids -- carefully monitor TSP boluses and give Time b/t each fed to her. Recommend aspiration precautions to include Small tsp bites/sips Slowly; alternate b/t each to aid pharyngeal clearing. Upright positioning for all oral intake; must be fully awake/alert for po intake. Full feeding support at meals. Pills CRUSHED in Puree for safer, easier swallowing -- it was encouraged now and for D/C to the Family.   Education given on Pills in Puree; diet/food consistencies and options; aspiration precautions; Dysphagia and risk for aspiration to pt and Family present. MD/NSG updated, agreed. MD consulted re: above and recommendation for Palliative Care for GOC discussion. Recommend Dietician f/u for support.  ST services will continue to monitor pt's status while admitted for ongoing needs per her GOC/POC. MD agreed. Husband and Family present for discussion and education re: aspiration risk w/ an oral diet; pt's presenting dysphagia; diet consistency (trial); feeding support/cues.       HPI HPI: Per H&P, Cassandra Glover is a 89 y.o. year old female with medical history of hypertension, hyperlipidemia, atrial fibrillation, CHF, hypothyroidism presenting to the ED with facial droop and left-sided weakness. LKW was 6 am per facility. On arrival to the ED patient was noted to be HDS stable.  Lab work and imaging obtained.  CBC with mild leukocytosis and mild thrombocytosis, normal hemoglobin.  CMP with mild hypokalemia at 3.2 and mild hypoalbuminemia.  Troponin mildly elevated but flat.  Code stroke initiated on patient arrival CT head without any acute findings.  CTA with no large vessel occlusion.  MRI  of the brain shows acute nonhemorrhagic infarct in the left  precentral gyrus.      SLP Plan  Continue with current plan of care        Swallow Evaluation Recommendations   Recommendations: PO diet PO Diet Recommendation: Dysphagia 1 (Pureed);Mildly thick liquids (Level 2, nectar thick) Liquid Administration via: Spoon;No straw Medication Administration: Crushed with puree Supervision: Full assist for feeding;Full supervision/cueing for swallowing strategies Postural changes: Position pt fully upright for meals;Stay upright 30-60 min after meals;Out of bed for meals Oral care recommendations: Oral care BID (2x/day);Oral care before PO;Staff/trained caregiver to provide oral care Recommended consults: Consider dietitian consultation;Consider Palliative care Caregiver Recommendations: Avoid jello, ice cream, thin soups, popsicles;Remove water pitcher;Have oral suction available     Recommendations   See above                 (Palliative Care) Oral care BID;Oral care before and after PO;Staff/trained caregiver to provide oral care   Frequent or constant Supervision/Assistance Dysphagia, oropharyngeal phase (R13.12) (suspect d/t impact of new stroke and new deficits; deconditioning; feeding dependency; advanced age)     Continue with current plan of care        Comer Portugal, MS, CCC-SLP Speech Language Pathologist Rehab Services; [facility] [place] [phone] (ascom) Cassandra Glover  07/14/2024, 4:46 PM

## 2024-07-15 DIAGNOSIS — I63412 Cerebral infarction due to embolism of left middle cerebral artery: Secondary | ICD-10-CM | POA: Diagnosis not present

## 2024-07-15 DIAGNOSIS — I482 Chronic atrial fibrillation, unspecified: Secondary | ICD-10-CM | POA: Diagnosis not present

## 2024-07-15 DIAGNOSIS — I10 Essential (primary) hypertension: Secondary | ICD-10-CM

## 2024-07-15 DIAGNOSIS — G20A1 Parkinson's disease without dyskinesia, without mention of fluctuations: Secondary | ICD-10-CM | POA: Diagnosis not present

## 2024-07-15 DIAGNOSIS — I639 Cerebral infarction, unspecified: Secondary | ICD-10-CM | POA: Diagnosis not present

## 2024-07-15 DIAGNOSIS — Z7982 Long term (current) use of aspirin: Secondary | ICD-10-CM | POA: Diagnosis not present

## 2024-07-15 DIAGNOSIS — I4891 Unspecified atrial fibrillation: Secondary | ICD-10-CM | POA: Diagnosis not present

## 2024-07-15 DIAGNOSIS — Z7189 Other specified counseling: Secondary | ICD-10-CM | POA: Diagnosis not present

## 2024-07-15 DIAGNOSIS — I6340 Cerebral infarction due to embolism of unspecified cerebral artery: Secondary | ICD-10-CM | POA: Diagnosis not present

## 2024-07-15 DIAGNOSIS — Z515 Encounter for palliative care: Secondary | ICD-10-CM | POA: Diagnosis not present

## 2024-07-15 LAB — GLUCOSE, CAPILLARY: Glucose-Capillary: 129 mg/dL — ABNORMAL HIGH (ref 70–99)

## 2024-07-15 MED ORDER — CARBIDOPA-LEVODOPA 25-100 MG PO TABS
2.0000 | ORAL_TABLET | Freq: Three times a day (TID) | ORAL | Status: AC
  Administered 2024-07-15 (×2): 2 via ORAL
  Filled 2024-07-15 (×2): qty 2

## 2024-07-15 MED ORDER — SODIUM CHLORIDE 0.9 % IV SOLN: INTRAVENOUS | Status: AC

## 2024-07-15 NOTE — TOC Progression Note (Addendum)
 Transition of Care [facility]) - Progression Note    Patient Details  Name: Cassandra Glover MRN: [id number] Date of Birth: 03/20/1932  Transition of Care [facility]) CM/SW Contact  Shasta DELENA Daring, RN Phone Number: 07/15/2024, 1:05 PM  Clinical Narrative:    RNCM advised patient's husband that she has 8 days remaining of STR (per [facility]). Husband said he wasn't sure they wanted to do that. Advised that the pateint is where she needs to be right now and that I will follow up tomorrow.  Received notification that Mr. Gorelick had mentioned hospice care. Returned to the room and confirmed he wanted to see the hospice liaison.    Notified liaison and advised husband, and family (now in the room also) that the request was made.  1:49 PM notified by hospice liaison that family is leaning towards returning to [facility] with Palliative follow up.    Expected Discharge Plan: Skilled Nursing Facility Barriers to Discharge: Continued Medical Work up               Expected Discharge Plan and Services   Discharge Planning Services: CM Consult                                           Social Drivers of Health (SDOH) Interventions SDOH Screenings   Food Insecurity: Patient Unable To Answer (07/14/2024)  Housing: Unknown (07/14/2024)  Transportation Needs: Patient Unable To Answer (07/14/2024)  Utilities: Patient Unable To Answer (07/14/2024)  Financial Resource Strain: Low Risk  (06/24/2024)   Received from [facility]  Social Connections: Patient Unable To Answer (07/14/2024)  Tobacco Use: Low Risk  (05/07/2024)   Received from [facility]    Readmission Risk Interventions     No data to display

## 2024-07-15 NOTE — Hospital Course (Signed)
 89 y.o. year old female with medical history of hypertension, hyperlipidemia, atrial fibrillation, CHF, hypothyroidism presenting to the ED with facial droop and left-sided weakness.    Assessment and Plan:   Acute CVA - Facial droop and left sided weakness on presentation.  Etiology likely from known A-fib (on Lovenox  at home).  No aphasia as well.  Showing improvement in her weakness but aphasia persists.  MRI noting acute infarct left precentral gyrus, also chronic lacunar infarcts.  Currently on aspirin .  Per neurology, consider initiation of heparin drip today.  PT/OT/speech on board.  Neurology following closely.   Atrial fibrillation with RVR - Initially on Cardizem  drip, subsequently weaned off to p.o. Cardizem .   Parkinson's disease - Continue Sinemet  and primidone .   Hypokalemia - Resolved after supplementation.   Hypothyroidism - Synthroid  on board.   Failure to thrive in adult/goals of care - Underlying Parkinson's use with acute stroke, aphasia.  Decreased p.o. intake even prior to hospitalization.  Palliative care consulted and following closely.  Husband at bedside stating they would not want to pursue NG tube.  Concerned about her overall nutritional intake.  Discussion this morning about observing her oral intake with hopes it would increase and patient would transition to STR.  However would consider home hospice care in the long-term if her nutrition is poor on top of her morbidities (Parkinson's, stroke, etc.).

## 2024-07-15 NOTE — Progress Notes (Signed)
 Occupational Therapy Treatment Patient Details Name: Cassandra Glover MRN: [id number] DOB: 1932/05/19 Today's Date: 07/15/2024   History of present illness Pt is a 89 y.o. 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 PMH significant for HTN, HLD, AFIB, CHF, basal cell carcinoma, Parkinsons, rectal prolapse   OT comments  Upon entering the room, pt supine in bed with family present in room. Pt with expressive difficulties this session and states,  I wish but unable to finish sentence. Pt placed into chair position and needed hand over hand assistance for oral care and to wash face. Pt very fatigued at this point. Total A to roll to the R and reposition in bed to protect skin integrity. Call bell and all needed items within reach.      If plan is discharge home, recommend the following:  Two people to help with walking and/or transfers;Two people to help with bathing/dressing/bathroom   Equipment Recommendations  Hospital bed       Precautions / Restrictions Precautions Precautions: Fall              ADL either performed or assessed with clinical judgement   ADL Overall ADL's : Needs assistance/impaired     Grooming: Wash/dry face;Oral care;Total assistance Grooming Details (indicate cue type and reason): hand over hand assistance                                    Extremity/Trunk Assessment Upper Extremity Assessment Upper Extremity Assessment: Generalized weakness                     Communication Communication Communication: Impaired Factors Affecting Communication: Difficulty expressing self;Reduced clarity of speech;Other (comment)   Cognition Arousal: Lethargic, Alert Behavior During Therapy: Flat affect Cognition: Difficult to assess Difficult to assess due to: Level of arousal, Impaired communication                             Following commands:  Impaired Following commands impaired: Follows one step commands inconsistently      Cueing   Cueing Techniques: Gestural cues, Tactile cues, Verbal cues  Exercises      Shoulder Instructions       General Comments      Pertinent Vitals/ Pain       Pain Assessment Pain Assessment: Faces Faces Pain Scale: No hurt  Home Living                                          Prior Functioning/Environment              Frequency  Min 2X/week        Progress Toward Goals  OT Goals(current goals can now be found in the care plan section)  Progress towards OT goals: Progressing toward goals     Plan      Co-evaluation                 AM-PAC OT 6 Clicks Daily Activity     Outcome Measure   Help from another person eating meals?: Total Help from another person taking care of personal grooming?: Total Help from another person toileting, which includes using toliet, bedpan, or urinal?: Total Help from another  person bathing (including washing, rinsing, drying)?: Total Help from another person to put on and taking off regular upper body clothing?: Total Help from another person to put on and taking off regular lower body clothing?: Total 6 Click Score: 6    End of Session    OT Visit Diagnosis: Muscle weakness (generalized) (M62.81);Other abnormalities of gait and mobility (R26.89);Unsteadiness on feet (R26.81);Cognitive communication deficit (R41.841);Other symptoms and signs involving cognitive function;Other symptoms and signs involving the nervous system (R29.898);Adult, failure to thrive (R62.7);Hemiplegia and hemiparesis Symptoms and signs involving cognitive functions: Cerebral infarction Hemiplegia - caused by: Cerebral infarction   Activity Tolerance Patient limited by fatigue   Patient Left in bed;with call bell/phone within reach;with family/visitor present   Nurse Communication          Time: 8461-8442 OT Time Calculation  (min): 19 min  Charges: OT General Charges $OT Visit: 1 Visit OT Treatments $Self Care/Home Management : 8-22 mins  Izetta Claude, MS, OTR/L , CBIS ascom [phone]  07/15/24, 4:49 PM

## 2024-07-15 NOTE — Plan of Care (Signed)
 VSS. RA. Family at bedside this shift. NS started at 75 ml/hr. Patient working on nutritional intake. Patient seen by speech. See note.  Problem: Education: Goal: Knowledge of disease or condition will improve Outcome: Progressing Goal: Knowledge of secondary prevention will improve (MUST DOCUMENT ALL) Outcome: Progressing Goal: Knowledge of patient specific risk factors will improve (DELETE if not current risk factor) Outcome: Progressing   Problem: Ischemic Stroke/TIA Tissue Perfusion: Goal: Complications of ischemic stroke/TIA will be minimized Outcome: Progressing   Problem: Coping: Goal: Will verbalize positive feelings about self Outcome: Progressing Goal: Will identify appropriate support needs Outcome: Progressing   Problem: Health Behavior/Discharge Planning: Goal: Ability to manage health-related needs will improve Outcome: Progressing Goal: Goals will be collaboratively established with patient/family Outcome: Progressing   Problem: Self-Care: Goal: Ability to participate in self-care as condition permits will improve Outcome: Progressing Goal: Verbalization of feelings and concerns over difficulty with self-care will improve Outcome: Progressing Goal: Ability to communicate needs accurately will improve Outcome: Progressing   Problem: Nutrition: Goal: Risk of aspiration will decrease Outcome: Progressing Goal: Dietary intake will improve Outcome: Progressing   Problem: Education: Goal: Knowledge of General Education information will improve Description: Including pain rating scale, medication(s)/side effects and non-pharmacologic comfort measures Outcome: Progressing   Problem: Health Behavior/Discharge Planning: Goal: Ability to manage health-related needs will improve Outcome: Progressing   Problem: Clinical Measurements: Goal: Ability to maintain clinical measurements within normal limits will improve Outcome: Progressing Goal: Will remain free from  infection Outcome: Progressing Goal: Diagnostic test results will improve Outcome: Progressing Goal: Respiratory complications will improve Outcome: Progressing Goal: Cardiovascular complication will be avoided Outcome: Progressing   Problem: Activity: Goal: Risk for activity intolerance will decrease Outcome: Progressing   Problem: Nutrition: Goal: Adequate nutrition will be maintained Outcome: Progressing   Problem: Coping: Goal: Level of anxiety will decrease Outcome: Progressing   Problem: Elimination: Goal: Will not experience complications related to bowel motility Outcome: Progressing Goal: Will not experience complications related to urinary retention Outcome: Progressing   Problem: Pain Managment: Goal: General experience of comfort will improve and/or be controlled Outcome: Progressing   Problem: Safety: Goal: Ability to remain free from injury will improve Outcome: Progressing   Problem: Skin Integrity: Goal: Risk for impaired skin integrity will decrease Outcome: Progressing   Problem: Education: Goal: Knowledge of disease or condition will improve Outcome: Progressing Goal: Knowledge of secondary prevention will improve (MUST DOCUMENT ALL) Outcome: Progressing Goal: Knowledge of patient specific risk factors will improve (DELETE if not current risk factor) Outcome: Progressing   Problem: Ischemic Stroke/TIA Tissue Perfusion: Goal: Complications of ischemic stroke/TIA will be minimized Outcome: Progressing   Problem: Coping: Goal: Will verbalize positive feelings about self Outcome: Progressing Goal: Will identify appropriate support needs Outcome: Progressing   Problem: Health Behavior/Discharge Planning: Goal: Ability to manage health-related needs will improve Outcome: Progressing Goal: Goals will be collaboratively established with patient/family Outcome: Progressing   Problem: Self-Care: Goal: Ability to participate in self-care as  condition permits will improve Outcome: Progressing Goal: Verbalization of feelings and concerns over difficulty with self-care will improve Outcome: Progressing Goal: Ability to communicate needs accurately will improve Outcome: Progressing   Problem: Nutrition: Goal: Risk of aspiration will decrease Outcome: Progressing Goal: Dietary intake will improve Outcome: Progressing

## 2024-07-15 NOTE — Progress Notes (Addendum)
 " Daily Progress Note   Patient Name: Cassandra Glover       Date: 07/15/2024 DOB: 25-Aug-1931  Age: 89 y.o. MRN#: [id number] Attending Physician: Arlon Carliss LELON, DO Primary Care Physician: Jyl Railing, MD Admit Date: 07/12/2024 Length of Stay: 3 days  Reason for Consultation/Follow-up: Establishing goals of care  Subjective:   CC: Establishing goals of care  Subjective: Chart reviewed including personal review of most recent notes from speech, primary hospitalist, TOC, and neurology.  I met today with patient's husband and son in the room for a family meeting.  Their daughter also joined us  via phone.  Patient's husband reports that she did well with breakfast this morning eating around 75%.  She is very sleepy after this and slept throughout the majority of the encounter.  I talked with him and his children regarding long-term goal and options for care moving forward.  We again reviewed conversation from yesterday with concerns about her continued decline in nutrition, cognition, and functional status moving forward.  Discussed that he has been her caregiver at home for the last year and she has now had an additional stroke with further neurologic impairment.  Her husband reports that he has been thinking that even if her nutrition improves, he may still want to consider transitioning home with hospice support as overall goal would be to spend as much time together and focus on her feeling as well as possible as he knows that everything that is going on is not reversible.  We talked about home hospice and care that is provided.  This included discussion of members of care team and services provided but also entailed conversation that hospice is a great 24-hour support at home, but it is not 24-hour in-home caregiving.  He reports that he would be doing the majority of caregiving in the home and that he would be interested in speaking further with hospice about exactly what they would  envision her care plan would look like.    Patient's children expressed that they would love for her to be at home, however, they feel that the care needs will be too much for their father to handle.  She does have a long-term insurance plan, however, it does not cover in-home service.  It only covers facility placement.  We also discussed option of pursuing continued rehab to see how much functional status she regains.  The hope with this per family would be to see if she improves enough that her caregiving needs can be met at home when she completes rehab.  They understand that this is not a guarantee, however her children feel this would be the safest way to move forward.  Husband requested to speak with [facility] liaison regarding potential home with hospice.  I let him know that I would place this referral and plan to follow-up tomorrow after we see how this conversation proceeded and we have a better idea about her nutritional intake.  Review of Systems Unable to assess Objective:   Vital Signs:  BP 114/68 (BP Location: Right Arm)   Pulse 100   Temp 98.6 F (37 C) (Axillary)   Resp (!) 22   Ht 5' 3 (1.6 m)   Wt 51.3 kg   SpO2 97%   BMI 20.03 kg/m   Physical Exam: General: NAD, sleepy today \ HENT: normocephalic, atraumatic, moist mucous membranes Cardiovascular: RRR, no edema in LE b/l Respiratory: no increased work of breathing noted, not in respiratory distress Abdomen: not distended Extremities:  Skin: no rashes or lesions on visible skin Neuro: Unable to assess that she slept throughout the encounter  Imaging: @IMAGES @  I personally reviewed recent imaging.   Assessment & Plan:   Assessment: 89 year old female with history of hypertension, hyperlipidemia, A-fib, CHF, hypothyroidism presented to the ED CAD and was found to have a new stroke.  Recommendations/Plan: # Complex medical decision making/goals of care:  - DNR/DNI  - Husband would like to discuss  home hospice services with [facility] collective.  He would like for patient to be at home, however, family reports they are concerned about safety and ability to meet her caregiving needs in her current functional state.  Children leaning toward plan for rehab at time of discharge to see how much functional status she can regain.  If she improved enough to the point of being able to transition home, they would certainly be open to this.  If, however, she continued to decline they reports she has a long-term care policy that they would utilize for her care.  - Plan for follow-up palliative care conversation once they are able to discuss options with home hospice and will continue to see how her nutrition progresses.  -  Code Status: Limited: Do not attempt resuscitation (DNR) -DNR-LIMITED -Do Not Intubate/DNI   # Psychosocial Support:  - Husband, son, and daughter  # Discharge Planning: To Be Determined -Home with hospice versus skilled facility with palliative care follow-up  -  Discussed with: Patient's family, TOC, Dr. Arlon  Thank you for allowing the palliative care team to participate in the care Elveria LELON Hight.  Amaryllis Meissner, MD Palliative Care Provider PMT # [phone]  If patient remains symptomatic despite maximum doses, please call PMT at [phone] between 0700 and 1900. Outside of these hours, please call attending, as PMT does not have night coverage.   I personally spent a total of 60 minutes in the care of the patient today including preparing to see the patient, getting/reviewing separately obtained history, performing a medically appropriate exam/evaluation, counseling and educating, placing orders, referring and communicating with other health care professionals, and documenting clinical information in the EHR.  "

## 2024-07-15 NOTE — Progress Notes (Signed)
 " Progress Note   Patient: Cassandra Glover FMW:[id number] DOB: 1932-01-03 DOA: 07/12/2024  DOS: the patient was seen and examined on 07/15/2024   Brief hospital course:  89 y.o. year old female with medical history of hypertension, hyperlipidemia, atrial fibrillation, CHF, hypothyroidism presenting to the ED with facial droop and left-sided weakness.   Assessment and Plan:  Acute CVA - Facial droop and left sided weakness on presentation.  Etiology likely from known A-fib (on Lovenox  at home).  No aphasia as well.  Showing improvement in her weakness but aphasia persists.  MRI noting acute infarct left precentral gyrus, also chronic lacunar infarcts.  Currently on aspirin .  Per neurology, consider initiation of heparin drip today.  PT/OT/speech on board.  Neurology following closely.  Atrial fibrillation with RVR - Initially on Cardizem  drip, subsequently weaned off to p.o. Cardizem .  Parkinson's disease - Continue Sinemet  and primidone .  Hypokalemia - Resolved after supplementation.  Hypothyroidism - Synthroid  on board.  Failure to thrive in adult/goals of care - Underlying Parkinson's use with acute stroke, aphasia.  Decreased p.o. intake even prior to hospitalization.  Palliative care consulted and following closely.  Husband at bedside stating they would not want to pursue NG tube.  Concerned about her overall nutritional intake.  Discussion this morning about observing her oral intake with hopes it would increase and patient would transition to STR.  However would consider home hospice care in the long-term if her nutrition is poor on top of her morbidities (Parkinson's, stroke, etc.).    Subjective: Patient resting comfortably this morning.  Still has some aphasia.  Husband at bedside.  Concerned about her nutritional intake as well as not having IV fluids running at this time.  Discussion about patient's ultimate goals of increasing p.o. intake if possible.  Physical  Exam:  Vitals:   07/15/24 0429 07/15/24 0500 07/15/24 0738 07/15/24 1133  BP: (!) 99/50  109/69 110/63  Pulse:   92 (!) 101  Resp: 20  (!) 24 20  Temp: 97.9 F (36.6 C)  97.7 F (36.5 C) 98.2 F (36.8 C)  TempSrc: Oral  Axillary Oral  SpO2: 95%  100% 96%  Weight:  51.3 kg    Height:        GENERAL:  Alert, pleasant, no acute distress, cachectic HEENT:  EOMI CARDIOVASCULAR: Irregularly irregular  RESPIRATORY:  Clear to auscultation, no wheezing, rales, or rhonchi GASTROINTESTINAL:  Soft, nontender, nondistended EXTREMITIES: Frail, no LE edema bilaterally NEURO: Aphasia/dysarthria, moving extremities SKIN:  No rashes noted, thin PSYCH:  Appropriate mood and affect     Data Reviewed:  Imaging Studies: EEG adult Result Date: 07/14/2024 Shelton Arlin KIDD, MD     07/14/2024 10:59 AM Patient Name: Cassandra Glover MRN: [id number] Epilepsy Attending: Arlin KIDD Shelton Referring Physician/Provider: Matthews Elida HERO, MD Date: 07/13/2024 Duration: 21.02 mins Patient history:  89 y.o. female with hx of HTN, HL, a fib on therapeutic lovenox , CHF hypothyroidism presenting to ED with L facial droop and LUE and LLE weakness. Level of alertness: Awake AEDs during EEG study: None Technical aspects: This EEG study was done with scalp electrodes positioned according to the 10-20 International system of electrode placement. Electrical activity was reviewed with band pass filter of 1-70Hz , sensitivity of 7 uV/mm, display speed of 102mm/sec with a 60Hz  notched filter applied as appropriate. EEG data were recorded continuously and digitally stored.  Video monitoring was available and reviewed as appropriate. Description: The posterior dominant rhythm consists of 8 Hz activity of moderate  voltage (25-35 uV) seen predominantly in posterior head regions, symmetric and reactive to eye opening and eye closing. EEG showed intermittent generalized 3 to 6 Hz theta-delta slowing. Physiologic photic driving was seen  during photic stimulation.  Hyperventilation was not performed.   ABNORMALITY - Intermittent slow, generalized IMPRESSION: This study is suggestive of mild diffuse encephalopathy. No seizures or epileptiform discharges were seen throughout the recording. Arlin MALVA Krebs   ECHOCARDIOGRAM COMPLETE BUBBLE STUDY Result Date: 07/13/2024    ECHOCARDIOGRAM REPORT   Patient Name:   Cassandra Glover Date of Exam: 07/13/2024 Medical Rec #:  [id number]          Height:       63.0 in Accession #:    [id number]         Weight:       109.1 lb Date of Birth:  1931/10/31          BSA:          1.495 m Patient Age:    89 years           BP:           121/73 mmHg Patient Gender: F                  HR:           91 bpm. Exam Location:  Inpatient Procedure: 2D Echo (Both Spectral and Color Flow Doppler were utilized during            procedure). Indications:     stroke  History:         Patient has no prior history of Echocardiogram examinations.                  Parkinsons, Arrythmias:Atrial Fibrillation; Risk                  Factors:Hypertension and Dyslipidemia.  Sonographer:     Tinnie Barefoot RDCS Referring Phys:  [id number] ELIDA HERO STACK Diagnosing Phys: Deatrice Cage MD IMPRESSIONS  1. Left ventricular ejection fraction, by estimation, is 50 to 55%. The left ventricle has low normal function. The left ventricle has no regional wall motion abnormalities. Left ventricular diastolic parameters are indeterminate.  2. Right ventricular systolic function is mildly reduced. The right ventricular size is mildly enlarged. Moderately increased right ventricular wall thickness. There is moderately elevated pulmonary artery systolic pressure. The estimated right ventricular systolic pressure is 55.6 mmHg.  3. Left atrial size was mildly dilated.  4. Right atrial size was severely dilated.  5. The mitral valve is normal in structure. Mild mitral valve regurgitation. No evidence of mitral stenosis.  6. Tricuspid valve regurgitation is  moderate to severe.  7. The aortic valve is normal in structure. Aortic valve regurgitation is mild. Aortic valve sclerosis/calcification is present, without any evidence of aortic stenosis.  8. Moderately dilated pulmonary artery.  9. The inferior vena cava is dilated in size with >50% respiratory variability, suggesting right atrial pressure of 8 mmHg. 10. Agitated saline contrast bubble study was negative, with no evidence of any interatrial shunt. FINDINGS  Left Ventricle: Left ventricular ejection fraction, by estimation, is 50 to 55%. The left ventricle has low normal function. The left ventricle has no regional wall motion abnormalities. The left ventricular internal cavity size was normal in size. There is no left ventricular hypertrophy. Left ventricular diastolic parameters are indeterminate. Right Ventricle: The right ventricular size is mildly enlarged. Moderately increased right ventricular wall thickness. Right  ventricular systolic function is mildly reduced. There is moderately elevated pulmonary artery systolic pressure. The tricuspid regurgitant velocity is 3.45 m/s, and with an assumed right atrial pressure of 8 mmHg, the estimated right ventricular systolic pressure is 55.6 mmHg. Left Atrium: Left atrial size was mildly dilated. Right Atrium: Right atrial size was severely dilated. Pericardium: There is no evidence of pericardial effusion. Mitral Valve: The mitral valve is normal in structure. Mild mitral valve regurgitation. No evidence of mitral valve stenosis. Tricuspid Valve: The tricuspid valve is normal in structure. Tricuspid valve regurgitation is moderate to severe. No evidence of tricuspid stenosis. Aortic Valve: The aortic valve is normal in structure. Aortic valve regurgitation is mild. Aortic regurgitation PHT measures 446 msec. Aortic valve sclerosis/calcification is present, without any evidence of aortic stenosis. Pulmonic Valve: The pulmonic valve was normal in structure. Pulmonic  valve regurgitation is trivial. No evidence of pulmonic stenosis. Aorta: The aortic root is normal in size and structure. Pulmonary Artery: The pulmonary artery is moderately dilated. Venous: The inferior vena cava is dilated in size with greater than 50% respiratory variability, suggesting right atrial pressure of 8 mmHg. IAS/Shunts: No atrial level shunt detected by color flow Doppler. Agitated saline contrast was given intravenously to evaluate for intracardiac shunting. Agitated saline contrast bubble study was negative, with no evidence of any interatrial shunt.  LEFT VENTRICLE PLAX 2D LVIDd:         3.50 cm LVIDs:         2.60 cm LV PW:         1.00 cm LV IVS:        0.90 cm LVOT diam:     1.90 cm LV SV:         30 LV SV Index:   20 LVOT Area:     2.84 cm LV IVRT:       60 msec  LV Volumes (MOD) LV vol d, MOD A2C: 40.6 ml LV vol d, MOD A4C: 42.8 ml LV vol s, MOD A2C: 20.2 ml LV vol s, MOD A4C: 21.3 ml LV SV MOD A2C:     20.4 ml LV SV MOD A4C:     42.8 ml LV SV MOD BP:      22.9 ml RIGHT VENTRICLE             IVC RV Basal diam:  2.80 cm     IVC diam: 1.90 cm RV S prime:     12.40 cm/s TAPSE (M-mode): 1.4 cm LEFT ATRIUM             Index        RIGHT ATRIUM           Index LA diam:        3.70 cm 2.48 cm/m   RA Area:     26.30 cm LA Vol (A2C):   68.0 ml 45.50 ml/m  RA Volume:   79.60 ml  53.26 ml/m LA Vol (A4C):   61.5 ml 41.15 ml/m LA Biplane Vol: 69.2 ml 46.30 ml/m  AORTIC VALVE LVOT Vmax:   66.67 cm/s LVOT Vmean:  44.700 cm/s LVOT VTI:    0.107 m AI PHT:      446 msec  AORTA Ao Root diam: 3.00 cm Ao Asc diam:  3.00 cm TRICUSPID VALVE TR Peak grad:   47.6 mmHg TR Vmax:        345.00 cm/s  SHUNTS Systemic VTI:  0.11 m Systemic Diam: 1.90 cm Deatrice Cage MD Electronically signed  by Deatrice Cage MD Signature Date/Time: 07/13/2024/10:49:54 AM    Final    DG Abd 1 View Result Date: 07/12/2024 EXAM: 1 VIEW XRAY OF THE ABDOMEN 07/12/2024 03:30:37 PM COMPARISON: None available. CLINICAL HISTORY: Abdominal  pain, acute. FINDINGS: BOWEL: Nonobstructive bowel gas pattern. Gas in left hemicolon. Moderate stool in distal colon. SOFT TISSUES: Vascular calcifications. BONES: No acute fracture. Thoracolumbar fusion hardware. Partially imaged right hip arthroplasty. CHEST BASE: Cardiomegaly. IMPRESSION: 1. Nonobstructive bowel gas pattern with moderate distal colonic stool burden. 2. Cardiomegaly and vascular calcifications. Electronically signed by: Greig Pique MD 07/12/2024 04:14 PM EST RP Workstation: [id number]   MR BRAIN WO CONTRAST Result Date: 07/12/2024 EXAM: MRI BRAIN WITHOUT CONTRAST 07/12/2024 11:53:20 AM TECHNIQUE: Multiplanar multisequence MRI of the head/brain was performed without the administration of intravenous contrast. COMPARISON: CT angiogram of the head and neck dated 07/12/2024. CLINICAL HISTORY: Neuro deficit, acute, stroke suspected. FINDINGS: BRAIN AND VENTRICLES: Acute non hemorrhagic infarction in the left precentral gyrus. There is age-related atrophy and mild-to-moderate cerebral white matter disease. There are chronic lacunar infarcts again demonstrated within the internal capsules bilaterally. No other acute intracranial hemorrhage. No mass. No midline shift. No hydrocephalus. The sella is unremarkable. Normal flow voids. ORBITS: The patient is status post bilateral lens replacement. SINUSES AND MASTOIDS: There is moderate mucosal disease within the paranasal sinuses. BONES AND SOFT TISSUES: Normal marrow signal. No soft tissue abnormality. IMPRESSION: 1. Restricted diffusion within the cortex of the left precentral gyrus compatible with acute non hemorrhagic infarction. 2. Age-related atrophy and mild-to-moderate cerebral white matter disease. 3. Chronic lacunar infarcts within the internal capsules bilaterally. 4. Moderate paranasal sinus mucosal disease. Electronically signed by: Evalene Coho MD 07/12/2024 12:03 PM EST RP Workstation: [id number]   CT ANGIO HEAD NECK W WO CM Result  Date: 07/12/2024 EXAM: CTA HEAD AND NECK  WITH 07/12/2024 09:12:00 AM TECHNIQUE: CTA of the head and neck was performed with the administration of 75 mL of iohexol  (OMNIPAQUE ) 350 MG/ML injection. Multiplanar 2D and/or 3D reformatted images are provided for review. Automated exposure control, iterative reconstruction, and/or weight based adjustment of the mA/kV was utilized to reduce the radiation dose to as low as reasonably achievable. Stenosis of the internal carotid arteries measured using NASCET criteria. COMPARISON: None available CLINICAL HISTORY: Neuro deficit, acute, stroke suspected; R facial droop L sided extremity weakness. FINDINGS: CTA NECK: AORTIC ARCH AND ARCH VESSELS: No dissection or arterial injury. No significant stenosis of the brachiocephalic or subclavian arteries. CERVICAL CAROTID ARTERIES: The common carotid and internal carotid arteries are tortuous. The right common carotid artery has a retropharyngeal bend, which displaces the esophagus to the left. There is mild calcific atheromatous disease within the carotid bulbs, but no flow-limiting stenosis. No dissection or arterial injury. CERVICAL VERTEBRAL ARTERIES: The vertebral arteries are codominant. No dissection, arterial injury, or significant stenosis. LUNGS AND MEDIASTINUM: Unremarkable. SOFT TISSUES: No acute abnormality. BONES: No acute abnormality. CTA HEAD: ANTERIOR CIRCULATION: There is mild calcific plaque within the carotid siphons with no flow-limiting stenosis. No significant stenosis of the anterior cerebral arteries. No significant stenosis of the middle cerebral arteries. No aneurysm. POSTERIOR CIRCULATION: No significant stenosis of the posterior cerebral arteries. No significant stenosis of the basilar artery. No significant stenosis of the vertebral arteries. No aneurysm. OTHER: There is paranasal sinus disease present. No dural venous sinus thrombosis on this non-dedicated study. The above findings were communicated to  Doctor Matthews at 9:19 AM on 07/12/2024. IMPRESSION: 1. No large vessel occlusion, hemodynamically significant stenosis, or aneurysm  in the head or neck. 2. Mild calcific atheromatous disease within the carotid bulbs and carotid siphons without flow-limiting stenosis. 3. Tortuous common carotid and internal carotid arteries, with the right common carotid artery having a retropharyngeal bend displacing the esophagus to the left. 4. Findings communicated to Dr. Matthews at 9:19 AM on 07/12/2024. Electronically signed by: Evalene Coho MD 07/12/2024 09:23 AM EST RP Workstation: [id number]   CT HEAD CODE STROKE WO CONTRAST Result Date: 07/12/2024 EXAM: CT HEAD WITHOUT CONTRAST 07/12/2024 08:55:00 AM TECHNIQUE: CT of the head was performed without the administration of intravenous contrast. Automated exposure control, iterative reconstruction, and/or weight based adjustment of the mA/kV was utilized to reduce the radiation dose to as low as reasonably achievable. COMPARISON: None available. CLINICAL HISTORY: Neuro deficit, acute, stroke suspected. FINDINGS: BRAIN AND VENTRICLES: No acute hemorrhage. No evidence of acute infarct. No hydrocephalus. No extra-axial collection. No mass effect or midline shift. There is age-related volume loss. There are chronic lacunar infarcts within the internal capsules bilaterally. ORBITS: No acute abnormality. The patient is status post bilateral lens replacement. SINUSES: There is an air fluid level within the right maxillary sinus and the left sphenoid sinus. There is moderate opacification of the ethmoid and sphenoid sinuses and the floor of the right frontal sinus. SOFT TISSUES AND SKULL: No acute soft tissue abnormality. No skull fracture. Findings were communicated to Doctor Matthews at 08:58 AM 07/12/2024. [place] Stroke Program Early CT Score (ASPECTS) ----- Ganglionic (caudate, IC, lentiform nucleus, insula, M1-M3): 7 Supraganglionic (M4-M6): 3 Total: 10 IMPRESSION: 1. No acute  intracranial abnormality to suggest acute infarct or hemorrhage in the setting of suspected acute stroke. 2. Age-related volume loss and chronic lacunar infarcts within the internal capsules bilaterally. 3. Moderate paranasal sinus disease with air-fluid levels in the right maxillary and left sphenoid sinuses, which can be seen with acute sinusitis. 4. These findings were communicated to Dr. Matthews at 08:58 AM on 07/12/2024. ASPECTS: 10. Electronically signed by: Evalene Coho MD 07/12/2024 09:00 AM EST RP Workstation: [id number]    There are no new results to review at this time.  Previous records (including but not limited to H&P, progress notes, nursing notes, TOC management) were reviewed in assessment of this patient.  Labs: CBC: Recent Labs  Lab 07/12/24 0848 07/13/24 0436  WBC 10.7* 11.9*  NEUTROABS 6.9  --   HGB 12.1 11.5*  HCT 36.9 35.7*  MCV 92.5 93.7  PLT 465* 412*   Basic Metabolic Panel: Recent Labs  Lab 07/12/24 0848 07/12/24 1052 07/13/24 0436  NA 135  --  134*  K 3.2*  --  4.1  CL 99  --  101  CO2 25  --  20*  GLUCOSE 104*  --  109*  BUN 12  --  14  CREATININE 0.61  --  0.86  CALCIUM 9.2  --  8.6*  MG  --  1.9  --    Liver Function Tests: Recent Labs  Lab 07/12/24 0848  AST 24  ALT 15  ALKPHOS 92  BILITOT 1.2  PROT 7.8  ALBUMIN 3.4*   CBG: Recent Labs  Lab 07/12/24 0845 07/13/24 0903 07/13/24 1323 07/14/24 0753 07/15/24 0809  GLUCAP 113* 128* 173* 99 129*    Scheduled Meds:   stroke: early stages of recovery book   Does not apply Once   aspirin  EC  81 mg Oral Daily   brimonidine   1 drop Right Eye BID   carbidopa -levodopa   2 tablet Oral TID   diltiazem   180 mg Oral QHS   dorzolamide -timolol   1 drop Both Eyes BID   latanoprost   1 drop Both Eyes QHS   levothyroxine   25 mcg Oral Once per day on Tuesday Thursday   levothyroxine   50 mcg Oral Once per day on Sunday Monday Wednesday Friday Saturday   multivitamin  1 tablet Oral Daily    pramipexole   0.5 mg Oral QHS   prednisoLONE  acetate  1 drop Both Eyes Daily   primidone   50 mg Oral BID   sodium chloride  flush  3 mL Intravenous Q12H   Continuous Infusions:  diltiazem  (CARDIZEM ) infusion Stopped (07/13/24 1902)   PRN Meds:.acetaminophen  **OR** acetaminophen , ondansetron  **OR** ondansetron  (ZOFRAN ) IV, senna-docusate  Family Communication: Husband at bedside  Disposition: Status is: Inpatient Remains inpatient appropriate because: CVA     Time spent: 38 minutes  Length of inpatient stay: 3 days  Author: Carliss LELON Canales, DO 07/15/2024 11:42 AM  For on call review [email or web address].   "

## 2024-07-15 NOTE — Progress Notes (Signed)
 Speech Language Pathology Treatment: Dysphagia  Patient Details Name: MARCINE GADWAY MRN: [id number] DOB: 06/30/1931 Today's Date: 07/15/2024 Time: 8544-8464 SLP Time Calculation (min) (ACUTE ONLY): 40 min  Assessment / Plan / Recommendation Clinical Impression  Pt seen for ongoing assessment of swallowing and ongoing toleration of oral diet. Husband and family/friend present in room w/ pt. Pt awake, alert and attempted verbalizations/approximations intermittently -- suspected Apraxia++. She was min slow to respond during po tasks.  Pt indicated w/ head shake no when not ready for another tsp as she was fed by this SLP; time given b/t trials as needed.   OF NOTE: NSG was concerned w/ pt's Husband's feeding presentation -- he often fed her quickly and seemed to overly feed her. Coughing noted during the intake.    Pt appears to present w/ oropharyngeal phase dysphagia w/ sensorimotor deficits. Pt appears at HIGH risk for aspiration currently as well as challenged to meet nutrition/hydration needs.  When following strict aspiration precautions w/ a Dysphagia diet including thickened liquids, pt appeared to adequately tolerate the po trials w/ no immediate, overt clinical s/s of aspiration noted. However, pt has challenging factors that could impact her oropharyngeal swallowing to include suspected impact of new stroke and new deficits; deconditioning; feeding dependency; advanced age. These factors can increase risk for aspiration, dysphagia as well as decreased oral intake overall.    During po trials, pt consumed trial consistencies of Nectar liquids via TSP and Puree w/ no immediate, overt coughing, decline in vocal quality, nor change in respiratory presentation during/post trials. Coughing noted 1x b/t trials -- suspect pharyngeal residue not fully cleared. Also noted soft s/s including multiple swallows and increased oral phase time; suspect delayed pharyngeal swallowing and potential  reduced residue clearing b/t trials. Oral phase appeared min-mod lengthy for bolus management and control of bolus propulsion for A-P transfer for swallowing. Oral clearing achieved when given Time -- pt indicated w/ head shake no when not ready for another tsp. Full feeding required.    Recommend continue a Dysphagia level 1 (puree) w/ well-moistened foods; Nectar liquids -- carefully monitor TSP boluses and give Time b/t each fed to her. NO STRAWS! Recommend aspiration precautions to include Small tsp bites/sips Slowly; alternate b/t each to aid pharyngeal clearing. Upright positioning for all oral intake; must be fully awake/alert for po intake. Full feeding support at meals. Pills CRUSHED in Puree for safer, easier swallowing -- it was encouraged now and for D/C to the Family.    Education given on Pills in Puree; diet/food consistencies and options; aspiration precautions; Dysphagia and risk for aspiration to pt and Family present. MD/NSG updated, agreed. MD consulted re: above and recommendation for ongoing Palliative Care for GOC discussion d/t concern for pt's ability to meet her nutrition/hydration needs adequately. Recommend Dietician f/u for support.  ST services will continue to monitor pt's status while admitted for ongoing needs per her GOC/POC -- recommend f/u at her next venue of care for ongoing Education, therapy. MD agreed.      HPI HPI: Per H&P, LADYE MACNAUGHTON is a 89 y.o. year old female with medical history of hypertension, hyperlipidemia, atrial fibrillation, CHF, hypothyroidism presenting to the ED with facial droop and left-sided weakness. LKW was 6 am per facility. On arrival to the ED patient was noted to be HDS stable.  Lab work and imaging obtained.  CBC with mild leukocytosis and mild thrombocytosis, normal hemoglobin.  CMP with mild hypokalemia at 3.2 and mild hypoalbuminemia.  Troponin  mildly elevated but flat.  Code stroke initiated on patient arrival CT head without  any acute findings.  CTA with no large vessel occlusion.  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      SLP Plan  Continue with current plan of care;Goals updated (monitor and f/u at next venue of care)        Swallow Evaluation Recommendations   Recommendations: PO diet PO Diet Recommendation: Dysphagia 1 (Pureed);Mildly thick liquids (Level 2, nectar thick) (tsp) Liquid Administration via: Spoon;No straw Medication Administration: Crushed with puree Supervision: Full assist for feeding;Full supervision/cueing for swallowing strategies Postural changes: Position pt fully upright for meals;Stay upright 30-60 min after meals;Out of bed for meals Oral care recommendations: Oral care BID (2x/day);Oral care before PO;Staff/trained caregiver to provide oral care Recommended consults: Consider dietitian consultation;Consider Palliative care Caregiver Recommendations: Avoid jello, ice cream, thin soups, popsicles;Remove water pitcher;Have oral suction available     Recommendations   See above                 (Rehab f/u) Oral care BID;Oral care before and after PO;Staff/trained caregiver to provide oral care   Frequent or constant Supervision/Assistance Dysphagia, oropharyngeal phase (R13.12) (suspect d/t impact of new stroke and new deficits; deconditioning; feeding dependency; advanced age)     Continue with current plan of care;Goals updated (monitor and f/u at next venue of care)       Comer Portugal, MS, CCC-SLP Speech Language Pathologist Rehab Services; [facility] [place] [phone] (ascom) Jawann Urbani  07/15/2024, 6:38 PM

## 2024-07-15 NOTE — Progress Notes (Addendum)
 Subjective: Husband at bedside feeding patient.  Patient without speech but eating well.    Objective: Current vital signs: BP 110/63 (BP Location: Right Arm)   Pulse (!) 101   Temp 98.2 F (36.8 C) (Oral)   Resp 20   Ht 5' 3 (1.6 m)   Wt 51.3 kg   SpO2 96%   BMI 20.03 kg/m  Vital signs in last 24 hours: Temp:  [97.7 F (36.5 C)-98.9 F (37.2 C)] 98.2 F (36.8 C) (01/21 1133) Pulse Rate:  [81-101] 101 (01/21 1133) Resp:  [19-24] 20 (01/21 1133) BP: (99-111)/(50-69) 110/63 (01/21 1133) SpO2:  [93 %-100 %] 96 % (01/21 1133) Weight:  [51.3 kg] 51.3 kg (01/21 0500)  Intake/Output from previous day: 01/20 0701 - 01/21 0700 In: 50 [P.O.:50] Out: 250 [Urine:250] Intake/Output this shift: Total I/O In: 120 [P.O.:120] Out: -  Nutritional status:  Diet Order             DIET - DYS 1 Room service appropriate? No; Fluid consistency: Nectar Thick  Diet effective now                   Neurologic Exam: Mental Status: Awake, alert.  No speech.  Eating Cranial Nerves: II: Does no blink to bilateral confrontation, but looks to either side of room when called III,IV, VI: extra-ocular motions intact bilaterally V,VII: mild right facial droop VIII: hearing normal bilaterally Motor: Able to lift both upper extremities against gravity but RUE drifts slowly back to bed.  Does not lift either leg off the bed.  Increased tone throughout Sensory: Withdraws and appreciates painful stimuli more on the left than the right Cerebellar: BUE tremor  Lab Results: Basic Metabolic Panel: Recent Labs  Lab 07/12/24 0848 07/12/24 1052 07/13/24 0436  NA 135  --  134*  K 3.2*  --  4.1  CL 99  --  101  CO2 25  --  20*  GLUCOSE 104*  --  109*  BUN 12  --  14  CREATININE 0.61  --  0.86  CALCIUM 9.2  --  8.6*  MG  --  1.9  --     Liver Function Tests: Recent Labs  Lab 07/12/24 0848  AST 24  ALT 15  ALKPHOS 92  BILITOT 1.2  PROT 7.8  ALBUMIN 3.4*   No results for input(s):  LIPASE, AMYLASE in the last 168 hours. Recent Labs  Lab 07/12/24 1652  AMMONIA 34    CBC: Recent Labs  Lab 07/12/24 0848 07/13/24 0436  WBC 10.7* 11.9*  NEUTROABS 6.9  --   HGB 12.1 11.5*  HCT 36.9 35.7*  MCV 92.5 93.7  PLT 465* 412*    Cardiac Enzymes: No results for input(s): CKTOTAL, CKMB, CKMBINDEX, TROPONINI in the last 168 hours.  Lipid Panel: Recent Labs  Lab 07/13/24 0436  CHOL 123  TRIG 58  HDL 36*  CHOLHDL 3.4  VLDL 12  LDLCALC 75    CBG: Recent Labs  Lab 07/12/24 0845 07/13/24 0903 07/13/24 1323 07/14/24 0753 07/15/24 0809  GLUCAP 113* 128* 173* 99 129*    Microbiology: No results found for this or any previous visit.  Coagulation Studies: No results for input(s): LABPROT, INR in the last 72 hours.  Imaging: EEG adult Result Date: 07/14/2024 Shelton Arlin KIDD, MD     07/14/2024 10:59 AM Patient Name: Cassandra Glover MRN: [id number] Epilepsy Attending: Arlin KIDD Shelton Referring Physician/Provider: Matthews Elida HERO, MD Date: 07/13/2024 Duration: 21.02 mins Patient history:  89 y.o. female with hx of HTN, HL, a fib on therapeutic lovenox , CHF hypothyroidism presenting to ED with L facial droop and LUE and LLE weakness. Level of alertness: Awake AEDs during EEG study: None Technical aspects: This EEG study was done with scalp electrodes positioned according to the 10-20 International system of electrode placement. Electrical activity was reviewed with band pass filter of 1-70Hz , sensitivity of 7 uV/mm, display speed of 31mm/sec with a 60Hz  notched filter applied as appropriate. EEG data were recorded continuously and digitally stored.  Video monitoring was available and reviewed as appropriate. Description: The posterior dominant rhythm consists of 8 Hz activity of moderate voltage (25-35 uV) seen predominantly in posterior head regions, symmetric and reactive to eye opening and eye closing. EEG showed intermittent generalized 3 to 6 Hz  theta-delta slowing. Physiologic photic driving was seen during photic stimulation.  Hyperventilation was not performed.   ABNORMALITY - Intermittent slow, generalized IMPRESSION: This study is suggestive of mild diffuse encephalopathy. No seizures or epileptiform discharges were seen throughout the recording. Priyanka O Yadav    Medications: I have reviewed the patient's current medications. Scheduled:   stroke: early stages of recovery book   Does not apply Once   aspirin  EC  81 mg Oral Daily   brimonidine   1 drop Right Eye BID   carbidopa -levodopa   2 tablet Oral TID   diltiazem   180 mg Oral QHS   dorzolamide -timolol   1 drop Both Eyes BID   latanoprost   1 drop Both Eyes QHS   levothyroxine   25 mcg Oral Once per day on Tuesday Thursday   levothyroxine   50 mcg Oral Once per day on Sunday Monday Wednesday Friday Saturday   multivitamin  1 tablet Oral Daily   pramipexole   0.5 mg Oral QHS   prednisoLONE  acetate  1 drop Both Eyes Daily   primidone   50 mg Oral BID   sodium chloride  flush  3 mL Intravenous Q12H    Assessment/Plan:  89 y.o. female with hx of HTN, HL, a fib on therapeutic lovenox , CHF hypothyroidism with right sided weakness.  MRI personally reviewed and significant for left precentral gyrus acute infarct likely embolic in etiology.  CTA of the head and neck personally reviewed and reveals no evidence of LVO.  On ASA.   LDL 75, A1c 5.9.  Echocardiogram with EF of 50-55% and no evidence of PFO.   B12, RPR, TSH, HIV normal Awake and eating.  May benefit from changing Sinemet  timing since per notes family is considering rehab.  Recommendations: Would change Sinemet  to 0800, 1300, 1800 daily   LOS: 3 days   Sonny Hock, MD Neurology  07/15/2024  3:12 PM

## 2024-07-15 NOTE — Progress Notes (Addendum)
 [facility] Liaison Note   Received request from PMT provider and ICM, to meet with family to explain hospice services at home, LTC and the [facility]. HL met with spouse and son, providing extensive education for hospice services.  Family requested additional time to consider options before proceeding, but are leaning toward SNF rehab with palliative follow up.   All questions answered and no concerns voiced.  [facility] information and contact numbers given to family & above information shared with TOC.   Please call with any questions/concerns.    Thank you for the opportunity to participate in this patient's care.   [facility] Liaison [phone]

## 2024-07-15 NOTE — NC FL2 (Signed)
 " [place]  MEDICAID FL2 LEVEL OF CARE FORM     IDENTIFICATION  Patient Name: Cassandra Glover Birthdate: 08/18/1931 Sex: female Admission Date (Current Location): 07/12/2024  [facility] and [place] Number:  Chiropodist and Address:  [facility], [address] [place], [place] [address]      Provider Number: [id number]  Attending Physician Name and Address:  Arlon Carliss LELON, DO  Relative Name and Phone Number:  Dodie Parisi, [phone]    Current Level of Care: Hospital Recommended Level of Care: Skilled Nursing Facility Prior Approval Number:    Date Approved/Denied:   PASRR Number: [id number]  Discharge Plan: SNF    Current Diagnoses: Patient Active Problem List   Diagnosis Date Noted   GERD (gastroesophageal reflux disease) 07/12/2024   Essential hypertension 07/12/2024   Hyperlipidemia 07/12/2024   Parkinson's disease (HCC) 07/12/2024   Hypothyroidism 07/12/2024   Atrial fibrillation, chronic (HCC) 07/12/2024   Glaucoma 07/12/2024   Osteoporosis 07/12/2024   Cerebral infarction (HCC) 07/12/2024    Orientation RESPIRATION BLADDER Height & Weight     Place  Normal Incontinent Weight: 51.3 kg Height:  5' 3 (160 cm)  BEHAVIORAL SYMPTOMS/MOOD NEUROLOGICAL BOWEL NUTRITION STATUS      Incontinent Diet (dysphagia 1 with nectar thick liquids)  AMBULATORY STATUS COMMUNICATION OF NEEDS Skin   Extensive Assist Non-Verbally (expressive aphasia) Skin abrasions (buttocks)                       Personal Care Assistance Level of Assistance  Bathing, Feeding, Dressing Bathing Assistance: Maximum assistance Feeding assistance: Limited assistance Dressing Assistance: Maximum assistance     Functional Limitations Info  Sight, Hearing, Speech Sight Info: Impaired Hearing Info: Adequate Speech Info: Impaired (expressive aphasia)    SPECIAL CARE FACTORS FREQUENCY  PT (By licensed PT), OT (By licensed OT)     PT  Frequency: 5 x week OT Frequency: 5 x week            Contractures Contractures Info: Present (Left hand)    Additional Factors Info  Code Status, Allergies Code Status Info: DNR Allergies Info: Sulfa Antibiotics, Simvastatin, Sulfamethoxazole           Current Medications (07/15/2024):  This is the current hospital active medication list Current Facility-Administered Medications  Medication Dose Route Frequency Provider Last Rate Last Admin    stroke: early stages of recovery book   Does not apply Once Fernand Prost, MD       acetaminophen  (TYLENOL ) tablet 650 mg  650 mg Oral Q6H PRN Khan, Ghalib, MD   650 mg at 07/13/24 2321   Or   acetaminophen  (TYLENOL ) suppository 650 mg  650 mg Rectal Q6H PRN Fernand Prost, MD       aspirin  EC tablet 81 mg  81 mg Oral Daily Agbata, Tochukwu, MD   81 mg at 07/15/24 0946   brimonidine  (ALPHAGAN ) 0.2 % ophthalmic solution 1 drop  1 drop Right Eye BID Agbata, Tochukwu, MD   1 drop at 07/15/24 0949   carbidopa -levodopa  (SINEMET  IR) 25-100 MG per tablet immediate release 2 tablet  2 tablet Oral TID Agbata, Tochukwu, MD   2 tablet at 07/15/24 0946   diltiazem  (CARDIZEM  CD) 24 hr capsule 180 mg  180 mg Oral QHS Agbata, Tochukwu, MD   180 mg at 07/14/24 2214   diltiazem  (CARDIZEM ) 125 mg in dextrose  5% 125 mL (1 mg/mL) infusion  5-15 mg/hr Intravenous Continuous Jacolyn Pae, MD  Stopped at 07/13/24 1902   dorzolamide -timolol  (COSOPT ) 2-0.5 % ophthalmic solution 1 drop  1 drop Both Eyes BID Agbata, Tochukwu, MD   1 drop at 07/15/24 0948   latanoprost  (XALATAN ) 0.005 % ophthalmic solution 1 drop  1 drop Both Eyes QHS Agbata, Tochukwu, MD   1 drop at 07/14/24 2213   levothyroxine  (SYNTHROID ) tablet 25 mcg  25 mcg Oral Once per day on Tuesday Thursday Agbata, Tochukwu, MD   25 mcg at 07/14/24 9362   levothyroxine  (SYNTHROID ) tablet 50 mcg  50 mcg Oral Once per day on Sunday Monday Wednesday Friday Saturday Lenon Elsie HERO, RPH   50 mcg at  07/15/24 0546   multivitamin (PROSIGHT) tablet 1 tablet  1 tablet Oral Daily Lenon Elsie HERO, RPH   1 tablet at 07/15/24 9052   ondansetron  (ZOFRAN ) tablet 4 mg  4 mg Oral Q6H PRN Fernand Prost, MD       Or   ondansetron  (ZOFRAN ) injection 4 mg  4 mg Intravenous Q6H PRN Fernand Prost, MD       pramipexole  (MIRAPEX ) tablet 0.5 mg  0.5 mg Oral QHS Agbata, Tochukwu, MD   0.5 mg at 07/14/24 2215   prednisoLONE  acetate (PRED FORTE ) 1 % ophthalmic suspension 1 drop  1 drop Both Eyes Daily Agbata, Tochukwu, MD   1 drop at 07/15/24 0947   primidone  (MYSOLINE ) tablet 50 mg  50 mg Oral BID Agbata, Tochukwu, MD   50 mg at 07/15/24 0947   senna-docusate (Senokot-S) tablet 1 tablet  1 tablet Oral QHS PRN Fernand Prost, MD       sodium chloride  flush (NS) 0.9 % injection 3 mL  3 mL Intravenous Q12H Khan, Ghalib, MD   3 mL at 07/15/24 9050     Discharge Medications: Please see discharge summary for a list of discharge medications.  Relevant Imaging Results:  Relevant Lab Results:   Additional Information SS#: [id number]  Shasta DELENA Daring, RN     "

## 2024-07-16 DIAGNOSIS — I482 Chronic atrial fibrillation, unspecified: Secondary | ICD-10-CM | POA: Diagnosis not present

## 2024-07-16 DIAGNOSIS — I63412 Cerebral infarction due to embolism of left middle cerebral artery: Secondary | ICD-10-CM | POA: Diagnosis not present

## 2024-07-16 DIAGNOSIS — Z515 Encounter for palliative care: Secondary | ICD-10-CM | POA: Diagnosis not present

## 2024-07-16 DIAGNOSIS — Z7189 Other specified counseling: Secondary | ICD-10-CM | POA: Diagnosis not present

## 2024-07-16 DIAGNOSIS — I10 Essential (primary) hypertension: Secondary | ICD-10-CM | POA: Diagnosis not present

## 2024-07-16 DIAGNOSIS — E039 Hypothyroidism, unspecified: Secondary | ICD-10-CM

## 2024-07-16 DIAGNOSIS — G20A1 Parkinson's disease without dyskinesia, without mention of fluctuations: Secondary | ICD-10-CM | POA: Diagnosis not present

## 2024-07-16 DIAGNOSIS — I639 Cerebral infarction, unspecified: Secondary | ICD-10-CM | POA: Diagnosis not present

## 2024-07-16 LAB — CBC
HCT: 31.8 % — ABNORMAL LOW (ref 36.0–46.0)
Hemoglobin: 10.4 g/dL — ABNORMAL LOW (ref 12.0–15.0)
MCH: 30.3 pg (ref 26.0–34.0)
MCHC: 32.7 g/dL (ref 30.0–36.0)
MCV: 92.7 fL (ref 80.0–100.0)
Platelets: 349 K/uL (ref 150–400)
RBC: 3.43 MIL/uL — ABNORMAL LOW (ref 3.87–5.11)
RDW: 15.9 % — ABNORMAL HIGH (ref 11.5–15.5)
WBC: 25.5 K/uL — ABNORMAL HIGH (ref 4.0–10.5)
nRBC: 0 % (ref 0.0–0.2)

## 2024-07-16 LAB — BASIC METABOLIC PANEL WITH GFR
Anion gap: 10 (ref 5–15)
BUN: 27 mg/dL — ABNORMAL HIGH (ref 8–23)
CO2: 22 mmol/L (ref 22–32)
Calcium: 8.8 mg/dL — ABNORMAL LOW (ref 8.9–10.3)
Chloride: 107 mmol/L (ref 98–111)
Creatinine, Ser: 0.94 mg/dL (ref 0.44–1.00)
GFR, Estimated: 58 mL/min — ABNORMAL LOW
Glucose, Bld: 115 mg/dL — ABNORMAL HIGH (ref 70–99)
Potassium: 3.3 mmol/L — ABNORMAL LOW (ref 3.5–5.1)
Sodium: 139 mmol/L (ref 135–145)

## 2024-07-16 LAB — MAGNESIUM: Magnesium: 2 mg/dL (ref 1.7–2.4)

## 2024-07-16 LAB — GLUCOSE, CAPILLARY: Glucose-Capillary: 112 mg/dL — ABNORMAL HIGH (ref 70–99)

## 2024-07-16 MED ORDER — CARBIDOPA-LEVODOPA 25-100 MG PO TABS: 2.0000 | ORAL_TABLET | Freq: Three times a day (TID) | ORAL | Status: DC

## 2024-07-16 MED ORDER — GUAIFENESIN ER 600 MG PO TB12: 600.0000 mg | ORAL_TABLET | Freq: Two times a day (BID) | ORAL | Status: DC | PRN

## 2024-07-16 MED ORDER — ENOXAPARIN SODIUM 60 MG/0.6ML IJ SOSY: 50.0000 mg | PREFILLED_SYRINGE | INTRAMUSCULAR | Status: DC

## 2024-07-16 MED ORDER — CARBIDOPA-LEVODOPA 25-100 MG PO TABS
2.0000 | ORAL_TABLET | Freq: Once | ORAL | Status: AC
  Administered 2024-07-16: 2 via ORAL
  Filled 2024-07-16: qty 2

## 2024-07-16 MED ORDER — CETIRIZINE HCL 10 MG PO TABS: 10.0000 mg | ORAL_TABLET | Freq: Every day | ORAL | Status: DC | PRN

## 2024-07-16 MED ORDER — ASPIRIN 81 MG PO TBEC: 81.0000 mg | DELAYED_RELEASE_TABLET | Freq: Every day | ORAL | Status: AC

## 2024-07-16 NOTE — Discharge Summary (Addendum)
 " Physician Discharge Summary   Patient: Cassandra Glover MRN: [id number] DOB: 10-13-1931  Admit date:     07/12/2024  Discharge date: 07/16/24  Discharge Physician: Carliss LELON Canales   PCP: Jyl Railing, MD   Recommendations at discharge:   Discharge rescinded.  Patient herself expressing desires that she does not [facility] to [facility].  However given her expressive aphasia/dysarthria, extreme difficulty in communicating.  Working closely with husband, son, patient, TOC on disposition planning.  Said we would allow another day to try to hash out definitive objective goals for disposition and ideally come up with a plan tomorrow whether they be rehab, with [facility], or home with hospice.   Pt to be discharged to [facility].   If you experience worsening fever, chills, chest pain, shortness of breath, or other concerning symptoms, please call your PCP or go to the emergency department immediately.  Discharge Diagnoses: Principal Problem:   Cerebral infarction [facility]) Active Problems:   GERD (gastroesophageal reflux disease)   Essential hypertension   Hyperlipidemia   Parkinson's disease (HCC)   Hypothyroidism   Atrial fibrillation, chronic (HCC)   Glaucoma   Osteoporosis  Resolved Problems:   * No resolved hospital problems. *   Hospital Course:  89 y.o. year old female with medical history of hypertension, hyperlipidemia, atrial fibrillation, CHF, hypothyroidism presenting to the ED with facial droop and left-sided weakness.    Assessment and Plan:   Acute CVA - Facial droop and left sided weakness on presentation.  Etiology likely from known A-fib (on Lovenox  at home).  Now aphasia as well.  Showing improvement in her weakness but aphasia persists.  MRI noting acute infarct left precentral gyrus, also chronic lacunar infarcts.  Currently on aspirin .  PT/OT/speech on board evaluations completed.  Neurology following closely.  Per neurology recommendations, continue aspirin   for 2 more days ending 1/24.  At that time discontinue aspirin  and transition back to Lovenox  daily thereafter.  Transition to SNF for ongoing PT/OT rehab.  Dysphagia/suspected apraxia - Evaluated by SLP.  Appears to present with severe oropharyngeal phase dysphagia.  High risk of aspiration.  Current recommendations to continue dysphagia 1 (pured) with well moistened foods, nectar liquids.  No straws!  Aspiration precautions, pills crushed.    Atrial fibrillation with RVR - Initially on Cardizem  drip, subsequently weaned off to p.o. Cardizem .  Rate well-controlled.   Parkinson's disease - Continue Sinemet  and primidone .  Per neurology recommendations, patient to take his Sinemet  at 0800, 1300, and 1800.   Hypokalemia - Resolved after supplementation.   Hypothyroidism - Synthroid  on board.   Failure to thrive in adult/goals of care - Underlying Parkinson's use with acute stroke, aphasia.  Decreased p.o. intake even prior to hospitalization.  Palliative care consulted and following closely.  Husband at bedside stating they would not want to pursue NG tube.  Concerned about her overall nutritional intake.  Discussion this morning about observing her oral intake with hopes it would increase and patient would transition to STR.  However would consider home hospice care in the long-term if her nutrition is poor on top of her morbidities (Parkinson's, stroke, etc.).  Goal to [facility].  Would recommend palliative care consult on DC to follow at [facility].   Consultants: Palliative care/hospice, neurology Procedures performed: None Disposition: Skilled nursing facility Diet recommendation:  Dysphagia type 1 nectar thick Liquid  DISCHARGE MEDICATION: Allergies as of 07/16/2024       Reactions  Sulfa Antibiotics Dermatitis, Hives   Hives  Hives  Hives sulfamethoxazole   Simvastatin Other (See Comments)   Joint pains   Sulfamethoxazole Rash    Hives        Medication List     STOP taking these medications    mupirocin  ointment 2 % Commonly known as: BACTROBAN        TAKE these medications    carbidopa -levodopa  25-100 MG tablet Commonly known as: SINEMET  IR Take 2 tablets by mouth 3 (three) times daily. Take 2 tablets at 0800, 1300, and 1800 daily What changed: additional instructions The timing of this medication is very important.   pramipexole  0.5 MG tablet Commonly known as: MIRAPEX  Take 0.5 mg by mouth at bedtime. The timing of this medication is very important.   acetaminophen  500 MG tablet Commonly known as: TYLENOL  Take 1,000 mg by mouth every 8 (eight) hours as needed.   aspirin  EC 81 MG tablet Take 1 tablet (81 mg total) by mouth daily for 1 day. Swallow whole. Start taking on: July 17, 2024   atorvastatin 20 MG tablet Commonly known as: LIPITOR Take by mouth.   brimonidine  0.2 % ophthalmic solution Commonly known as: ALPHAGAN  Place 1 drop into the right eye 2 (two) times daily.   Calcium-Vitamin D 500-125 MG-UNIT Tabs Take 1 tablet by mouth 2 (two) times daily.   cetirizine  10 MG tablet Commonly known as: ZYRTEC  Take 1 tablet (10 mg total) by mouth daily as needed for allergies or rhinitis. What changed:  when to take this reasons to take this   Cholecalciferol 50 MCG (2000 UT) Caps Take 1 capsule by mouth daily.   diltiazem  180 MG 24 hr capsule Commonly known as: CARDIZEM  CD Take 180 mg by mouth at bedtime.   dorzolamide -timolol  2-0.5 % ophthalmic solution Commonly known as: COSOPT  Place 1 drop into both eyes 2 (two) times daily.   enoxaparin  60 MG/0.6ML injection Commonly known as: LOVENOX  Inject 0.5 mLs (50 mg total) into the skin daily. Start taking on: July 18, 2024 What changed: These instructions start on July 18, 2024. If you are unsure what to do until then, ask your doctor or other care provider.   guaiFENesin  600 MG 12 hr tablet Commonly known as:  MUCINEX  Take 1 tablet (600 mg total) by mouth 2 (two) times daily as needed for up to 14 days for cough or to loosen phlegm. What changed:  when to take this reasons to take this   latanoprost  0.005 % ophthalmic solution Commonly known as: XALATAN  Place 1 drop into both eyes at bedtime.   levothyroxine  25 MCG tablet Commonly known as: SYNTHROID  Take 25 mcg by mouth daily. On Tuesday and Thursday   levothyroxine  25 MCG tablet Commonly known as: SYNTHROID  Take 2 tablets by mouth daily. On Sun, Mon, Wed, Fri, and Sat   prednisoLONE  acetate 1 % ophthalmic suspension Commonly known as: PRED FORTE  Place 1 drop into both eyes daily.   PreserVision AREDS 2 Caps Take 1 capsule by mouth 2 (two) times daily with a meal.   primidone  50 MG tablet Commonly known as: MYSOLINE  Take 50 mg by mouth 2 (two) times daily.   senna-docusate 8.6-50 MG tablet Commonly known as: Senokot-S Take 2 tablets by mouth 2 (two) times daily as needed.               Discharge Care Instructions  (From admission, onward)           Start  Ordered   07/16/24 0000  Discharge wound care:       Comments: Cleanse with Vashe, pat dry Apply xeroform and foam. Change daily   07/16/24 1044            Contact information for after-discharge care     Destination     [facility]   Service: Skilled Nursing Contact information: [address] [place]  [address] [phone]                     Discharge Exam: Filed Weights   07/14/24 0500 07/15/24 0500 07/16/24 0500  Weight: 50.7 kg 51.3 kg 51.6 kg    GENERAL:  Alert, pleasant, no acute distress, cachectic HEENT:  EOMI CARDIOVASCULAR: Irregularly irregular  RESPIRATORY:  Clear to auscultation, no wheezing, rales, or rhonchi GASTROINTESTINAL:  Soft, nontender, nondistended EXTREMITIES: Frail, no LE edema bilaterally NEURO: Aphasia/dysarthria, moving extremities SKIN:  No rashes noted,  thin PSYCH:  Appropriate mood and affect    Condition at discharge: improving  The results of significant diagnostics from this hospitalization (including imaging, microbiology, ancillary and laboratory) are listed below for reference.   Imaging Studies: EEG adult Result Date: 07/14/2024 Shelton Arlin KIDD, MD     07/16/2024 10:00 AM Patient Name: LAKERIA STARKMAN MRN: [id number] Epilepsy Attending: Arlin KIDD Shelton Referring Physician/Provider: Matthews Elida HERO, MD Date: 07/13/2024 Duration: 21.02 mins Patient history:  89 y.o. female with hx of HTN, HL, a fib on therapeutic lovenox , CHF hypothyroidism presenting to ED with L facial droop and LUE and LLE weakness. EEG to evaluate for seizure. Level of alertness: Awake AEDs during EEG study: None Technical aspects: This EEG study was done with scalp electrodes positioned according to the 10-20 International system of electrode placement. Electrical activity was reviewed with band pass filter of 1-70Hz , sensitivity of 7 uV/mm, display speed of 32mm/sec with a 60Hz  notched filter applied as appropriate. EEG data were recorded continuously and digitally stored.  Video monitoring was available and reviewed as appropriate. Description: The posterior dominant rhythm consists of 8 Hz activity of moderate voltage (25-35 uV) seen predominantly in posterior head regions, symmetric and reactive to eye opening and eye closing. EEG showed intermittent generalized 3 to 6 Hz theta-delta slowing. Physiologic photic driving was seen during photic stimulation.  Hyperventilation was not performed.   ABNORMALITY - Intermittent slow, generalized IMPRESSION: This study is suggestive of mild diffuse encephalopathy. No seizures or epileptiform discharges were seen throughout the recording. Arlin KIDD Shelton   ECHOCARDIOGRAM COMPLETE BUBBLE STUDY Result Date: 07/13/2024    ECHOCARDIOGRAM REPORT   Patient Name:   REANN DOBIAS Date of Exam: 07/13/2024 Medical Rec #:  [id number]           Height:       63.0 in Accession #:    [id number]         Weight:       109.1 lb Date of Birth:  05/27/1932          BSA:          1.495 m Patient Age:    89 years           BP:           121/73 mmHg Patient Gender: F                  HR:           91 bpm. Exam Location:  Inpatient Procedure: 2D  Echo (Both Spectral and Color Flow Doppler were utilized during            procedure). Indications:     stroke  History:         Patient has no prior history of Echocardiogram examinations.                  Parkinsons, Arrythmias:Atrial Fibrillation; Risk                  Factors:Hypertension and Dyslipidemia.  Sonographer:     Tinnie Barefoot RDCS Referring Phys:  [id number] ELIDA HERO STACK Diagnosing Phys: Deatrice Cage MD IMPRESSIONS  1. Left ventricular ejection fraction, by estimation, is 50 to 55%. The left ventricle has low normal function. The left ventricle has no regional wall motion abnormalities. Left ventricular diastolic parameters are indeterminate.  2. Right ventricular systolic function is mildly reduced. The right ventricular size is mildly enlarged. Moderately increased right ventricular wall thickness. There is moderately elevated pulmonary artery systolic pressure. The estimated right ventricular systolic pressure is 55.6 mmHg.  3. Left atrial size was mildly dilated.  4. Right atrial size was severely dilated.  5. The mitral valve is normal in structure. Mild mitral valve regurgitation. No evidence of mitral stenosis.  6. Tricuspid valve regurgitation is moderate to severe.  7. The aortic valve is normal in structure. Aortic valve regurgitation is mild. Aortic valve sclerosis/calcification is present, without any evidence of aortic stenosis.  8. Moderately dilated pulmonary artery.  9. The inferior vena cava is dilated in size with >50% respiratory variability, suggesting right atrial pressure of 8 mmHg. 10. Agitated saline contrast bubble study was negative, with no evidence of any interatrial  shunt. FINDINGS  Left Ventricle: Left ventricular ejection fraction, by estimation, is 50 to 55%. The left ventricle has low normal function. The left ventricle has no regional wall motion abnormalities. The left ventricular internal cavity size was normal in size. There is no left ventricular hypertrophy. Left ventricular diastolic parameters are indeterminate. Right Ventricle: The right ventricular size is mildly enlarged. Moderately increased right ventricular wall thickness. Right ventricular systolic function is mildly reduced. There is moderately elevated pulmonary artery systolic pressure. The tricuspid regurgitant velocity is 3.45 m/s, and with an assumed right atrial pressure of 8 mmHg, the estimated right ventricular systolic pressure is 55.6 mmHg. Left Atrium: Left atrial size was mildly dilated. Right Atrium: Right atrial size was severely dilated. Pericardium: There is no evidence of pericardial effusion. Mitral Valve: The mitral valve is normal in structure. Mild mitral valve regurgitation. No evidence of mitral valve stenosis. Tricuspid Valve: The tricuspid valve is normal in structure. Tricuspid valve regurgitation is moderate to severe. No evidence of tricuspid stenosis. Aortic Valve: The aortic valve is normal in structure. Aortic valve regurgitation is mild. Aortic regurgitation PHT measures 446 msec. Aortic valve sclerosis/calcification is present, without any evidence of aortic stenosis. Pulmonic Valve: The pulmonic valve was normal in structure. Pulmonic valve regurgitation is trivial. No evidence of pulmonic stenosis. Aorta: The aortic root is normal in size and structure. Pulmonary Artery: The pulmonary artery is moderately dilated. Venous: The inferior vena cava is dilated in size with greater than 50% respiratory variability, suggesting right atrial pressure of 8 mmHg. IAS/Shunts: No atrial level shunt detected by color flow Doppler. Agitated saline contrast was given intravenously to  evaluate for intracardiac shunting. Agitated saline contrast bubble study was negative, with no evidence of any interatrial shunt.  LEFT VENTRICLE PLAX 2D LVIDd:  3.50 cm LVIDs:         2.60 cm LV PW:         1.00 cm LV IVS:        0.90 cm LVOT diam:     1.90 cm LV SV:         30 LV SV Index:   20 LVOT Area:     2.84 cm LV IVRT:       60 msec  LV Volumes (MOD) LV vol d, MOD A2C: 40.6 ml LV vol d, MOD A4C: 42.8 ml LV vol s, MOD A2C: 20.2 ml LV vol s, MOD A4C: 21.3 ml LV SV MOD A2C:     20.4 ml LV SV MOD A4C:     42.8 ml LV SV MOD BP:      22.9 ml RIGHT VENTRICLE             IVC RV Basal diam:  2.80 cm     IVC diam: 1.90 cm RV S prime:     12.40 cm/s TAPSE (M-mode): 1.4 cm LEFT ATRIUM             Index        RIGHT ATRIUM           Index LA diam:        3.70 cm 2.48 cm/m   RA Area:     26.30 cm LA Vol (A2C):   68.0 ml 45.50 ml/m  RA Volume:   79.60 ml  53.26 ml/m LA Vol (A4C):   61.5 ml 41.15 ml/m LA Biplane Vol: 69.2 ml 46.30 ml/m  AORTIC VALVE LVOT Vmax:   66.67 cm/s LVOT Vmean:  44.700 cm/s LVOT VTI:    0.107 m AI PHT:      446 msec  AORTA Ao Root diam: 3.00 cm Ao Asc diam:  3.00 cm TRICUSPID VALVE TR Peak grad:   47.6 mmHg TR Vmax:        345.00 cm/s  SHUNTS Systemic VTI:  0.11 m Systemic Diam: 1.90 cm Deatrice Cage MD Electronically signed by Deatrice Cage MD Signature Date/Time: 07/13/2024/10:49:54 AM    Final    DG Abd 1 View Result Date: 07/12/2024 EXAM: 1 VIEW XRAY OF THE ABDOMEN 07/12/2024 03:30:37 PM COMPARISON: None available. CLINICAL HISTORY: Abdominal pain, acute. FINDINGS: BOWEL: Nonobstructive bowel gas pattern. Gas in left hemicolon. Moderate stool in distal colon. SOFT TISSUES: Vascular calcifications. BONES: No acute fracture. Thoracolumbar fusion hardware. Partially imaged right hip arthroplasty. CHEST BASE: Cardiomegaly. IMPRESSION: 1. Nonobstructive bowel gas pattern with moderate distal colonic stool burden. 2. Cardiomegaly and vascular calcifications. Electronically signed  by: Greig Pique MD 07/12/2024 04:14 PM EST RP Workstation: [id number]   MR BRAIN WO CONTRAST Result Date: 07/12/2024 EXAM: MRI BRAIN WITHOUT CONTRAST 07/12/2024 11:53:20 AM TECHNIQUE: Multiplanar multisequence MRI of the head/brain was performed without the administration of intravenous contrast. COMPARISON: CT angiogram of the head and neck dated 07/12/2024. CLINICAL HISTORY: Neuro deficit, acute, stroke suspected. FINDINGS: BRAIN AND VENTRICLES: Acute non hemorrhagic infarction in the left precentral gyrus. There is age-related atrophy and mild-to-moderate cerebral white matter disease. There are chronic lacunar infarcts again demonstrated within the internal capsules bilaterally. No other acute intracranial hemorrhage. No mass. No midline shift. No hydrocephalus. The sella is unremarkable. Normal flow voids. ORBITS: The patient is status post bilateral lens replacement. SINUSES AND MASTOIDS: There is moderate mucosal disease within the paranasal sinuses. BONES AND SOFT TISSUES: Normal marrow signal. No soft tissue abnormality. IMPRESSION: 1. Restricted diffusion  within the cortex of the left precentral gyrus compatible with acute non hemorrhagic infarction. 2. Age-related atrophy and mild-to-moderate cerebral white matter disease. 3. Chronic lacunar infarcts within the internal capsules bilaterally. 4. Moderate paranasal sinus mucosal disease. Electronically signed by: Evalene Coho MD 07/12/2024 12:03 PM EST RP Workstation: [id number]   CT ANGIO HEAD NECK W WO CM Result Date: 07/12/2024 EXAM: CTA HEAD AND NECK  WITH 07/12/2024 09:12:00 AM TECHNIQUE: CTA of the head and neck was performed with the administration of 75 mL of iohexol  (OMNIPAQUE ) 350 MG/ML injection. Multiplanar 2D and/or 3D reformatted images are provided for review. Automated exposure control, iterative reconstruction, and/or weight based adjustment of the mA/kV was utilized to reduce the radiation dose to as low as reasonably  achievable. Stenosis of the internal carotid arteries measured using NASCET criteria. COMPARISON: None available CLINICAL HISTORY: Neuro deficit, acute, stroke suspected; R facial droop L sided extremity weakness. FINDINGS: CTA NECK: AORTIC ARCH AND ARCH VESSELS: No dissection or arterial injury. No significant stenosis of the brachiocephalic or subclavian arteries. CERVICAL CAROTID ARTERIES: The common carotid and internal carotid arteries are tortuous. The right common carotid artery has a retropharyngeal bend, which displaces the esophagus to the left. There is mild calcific atheromatous disease within the carotid bulbs, but no flow-limiting stenosis. No dissection or arterial injury. CERVICAL VERTEBRAL ARTERIES: The vertebral arteries are codominant. No dissection, arterial injury, or significant stenosis. LUNGS AND MEDIASTINUM: Unremarkable. SOFT TISSUES: No acute abnormality. BONES: No acute abnormality. CTA HEAD: ANTERIOR CIRCULATION: There is mild calcific plaque within the carotid siphons with no flow-limiting stenosis. No significant stenosis of the anterior cerebral arteries. No significant stenosis of the middle cerebral arteries. No aneurysm. POSTERIOR CIRCULATION: No significant stenosis of the posterior cerebral arteries. No significant stenosis of the basilar artery. No significant stenosis of the vertebral arteries. No aneurysm. OTHER: There is paranasal sinus disease present. No dural venous sinus thrombosis on this non-dedicated study. The above findings were communicated to Doctor Matthews at 9:19 AM on 07/12/2024. IMPRESSION: 1. No large vessel occlusion, hemodynamically significant stenosis, or aneurysm in the head or neck. 2. Mild calcific atheromatous disease within the carotid bulbs and carotid siphons without flow-limiting stenosis. 3. Tortuous common carotid and internal carotid arteries, with the right common carotid artery having a retropharyngeal bend displacing the esophagus to the left.  4. Findings communicated to Dr. Matthews at 9:19 AM on 07/12/2024. Electronically signed by: Evalene Coho MD 07/12/2024 09:23 AM EST RP Workstation: [id number]   CT HEAD CODE STROKE WO CONTRAST Result Date: 07/12/2024 EXAM: CT HEAD WITHOUT CONTRAST 07/12/2024 08:55:00 AM TECHNIQUE: CT of the head was performed without the administration of intravenous contrast. Automated exposure control, iterative reconstruction, and/or weight based adjustment of the mA/kV was utilized to reduce the radiation dose to as low as reasonably achievable. COMPARISON: None available. CLINICAL HISTORY: Neuro deficit, acute, stroke suspected. FINDINGS: BRAIN AND VENTRICLES: No acute hemorrhage. No evidence of acute infarct. No hydrocephalus. No extra-axial collection. No mass effect or midline shift. There is age-related volume loss. There are chronic lacunar infarcts within the internal capsules bilaterally. ORBITS: No acute abnormality. The patient is status post bilateral lens replacement. SINUSES: There is an air fluid level within the right maxillary sinus and the left sphenoid sinus. There is moderate opacification of the ethmoid and sphenoid sinuses and the floor of the right frontal sinus. SOFT TISSUES AND SKULL: No acute soft tissue abnormality. No skull fracture. Findings were communicated to Doctor Matthews at 08:58 AM 07/12/2024. [place] Stroke  Program Early CT Score (ASPECTS) ----- Ganglionic (caudate, IC, lentiform nucleus, insula, M1-M3): 7 Supraganglionic (M4-M6): 3 Total: 10 IMPRESSION: 1. No acute intracranial abnormality to suggest acute infarct or hemorrhage in the setting of suspected acute stroke. 2. Age-related volume loss and chronic lacunar infarcts within the internal capsules bilaterally. 3. Moderate paranasal sinus disease with air-fluid levels in the right maxillary and left sphenoid sinuses, which can be seen with acute sinusitis. 4. These findings were communicated to Dr. Matthews at 08:58 AM on 07/12/2024.  ASPECTS: 10. Electronically signed by: Evalene Coho MD 07/12/2024 09:00 AM EST RP Workstation: [id number]    Microbiology: No results found for this or any previous visit.  Labs: CBC: Recent Labs  Lab 07/12/24 0848 07/13/24 0436 07/16/24 0326  WBC 10.7* 11.9* 25.5*  NEUTROABS 6.9  --   --   HGB 12.1 11.5* 10.4*  HCT 36.9 35.7* 31.8*  MCV 92.5 93.7 92.7  PLT 465* 412* 349   Basic Metabolic Panel: Recent Labs  Lab 07/12/24 0848 07/12/24 1052 07/13/24 0436 07/16/24 0326  NA 135  --  134* 139  K 3.2*  --  4.1 3.3*  CL 99  --  101 107  CO2 25  --  20* 22  GLUCOSE 104*  --  109* 115*  BUN 12  --  14 27*  CREATININE 0.61  --  0.86 0.94  CALCIUM 9.2  --  8.6* 8.8*  MG  --  1.9  --  2.0   Liver Function Tests: Recent Labs  Lab 07/12/24 0848  AST 24  ALT 15  ALKPHOS 92  BILITOT 1.2  PROT 7.8  ALBUMIN 3.4*   CBG: Recent Labs  Lab 07/13/24 0903 07/13/24 1323 07/14/24 0753 07/15/24 0809 07/16/24 0735  GLUCAP 128* 173* 99 129* 112*    Discharge time spent: 33 minutes.  Length of inpatient stay: 4 days  Signed: Carliss LELON Canales, DO [facility] 07/16/2024         "

## 2024-07-16 NOTE — Progress Notes (Signed)
 Physical Therapy Treatment Patient Details Name: Cassandra Glover MRN: [id number] DOB: 08/08/1931 Today's Date: 07/16/2024   History of Present Illness Pt is a 89 y.o. 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 PMH significant for HTN, HLD, AFIB, CHF, basal cell carcinoma, Parkinsons, rectal prolapse    PT Comments  Patient is awake and alert during session. Spouse at the bedside. Patient does initiate sitting up on edge of bed with multi modal cues. She continues to require physical assistance with all mobility and is fatigued with activity. Facilitation required to maintain midline sitting balance for around 6-7 minutes. Standing not attempted due to poor sitting balance. Rehabilitation < 3 hours/day recommended after this hospital stay. PT will continue to follow.    If plan is discharge home, recommend the following: Two people to help with walking and/or transfers;Two people to help with bathing/dressing/bathroom;Assistance with feeding;Assistance with cooking/housework;Direct supervision/assist for medications management;Direct supervision/assist for financial management;Assist for transportation;Help with stairs or ramp for entrance   Can travel by private vehicle     No  Equipment Recommendations   (TBD at next level of care)    Recommendations for Other Services       Precautions / Restrictions Precautions Precautions: Fall Recall of Precautions/Restrictions: Impaired Restrictions Weight Bearing Restrictions Per Provider Order: No     Mobility  Bed Mobility Overal bed mobility: Needs Assistance Bed Mobility: Supine to Sit, Sit to Supine     Supine to sit: Max assist Sit to supine: Total assist   General bed mobility comments: patient initiated sitting up on left side of bed with cues. she continues to require significant assistance with mobility efforts    Transfers                   General  transfer comment: not attempted, poor sitting balance    Ambulation/Gait                   Stairs             Wheelchair Mobility     Tilt Bed    Modified Rankin (Stroke Patients Only)       Balance Overall balance assessment: Needs assistance Sitting-balance support: Bilateral upper extremity supported Sitting balance-Leahy Scale: Poor Sitting balance - Comments: posterior and left lean. faciliation and cues for midline, anterior weight shifting                                    Communication Communication Communication: Impaired Factors Affecting Communication: Difficulty expressing self;Other (comment)  Cognition Arousal: Alert Behavior During Therapy: Flat affect   PT - Cognitive impairments: Difficult to assess Difficult to assess due to: Impaired communication                       Following commands: Impaired Following commands impaired: Follows one step commands inconsistently    Cueing Cueing Techniques: Gestural cues, Tactile cues, Verbal cues  Exercises      General Comments        Pertinent Vitals/Pain Pain Assessment Pain Assessment: PAINAD Negative Vocalization: none Facial Expression: smiling or inexpressive Body Language: tense, distressed pacing, fidgeting Consolability: no need to console Pain Intervention(s): Monitored during session    Home Living  Prior Function            PT Goals (current goals can now be found in the care plan section) Acute Rehab PT Goals Patient Stated Goal: family goal is rehab PT Goal Formulation: With family Time For Goal Achievement: 07/26/24 Potential to Achieve Goals: Fair Progress towards PT goals: Progressing toward goals    Frequency    Min 1X/week      PT Plan      Co-evaluation              AM-PAC PT 6 Clicks Mobility   Outcome Measure  Help needed turning from your back to your side while in a flat  bed without using bedrails?: Total Help needed moving from lying on your back to sitting on the side of a flat bed without using bedrails?: Total Help needed moving to and from a bed to a chair (including a wheelchair)?: Total Help needed standing up from a chair using your arms (e.g., wheelchair or bedside chair)?: Total Help needed to walk in hospital room?: Total Help needed climbing 3-5 steps with a railing? : Total 6 Click Score: 6    End of Session   Activity Tolerance: Patient limited by fatigue Patient left: in bed;with call bell/phone within reach;with bed alarm set;with nursing/sitter in room (spouse at bedside.) Nurse Communication:  (MD present for part of session) PT Visit Diagnosis: Muscle weakness (generalized) (M62.81);Unsteadiness on feet (R26.81);Adult, failure to thrive (R62.7);Hemiplegia and hemiparesis     Time: 9160-9142 PT Time Calculation (min) (ACUTE ONLY): 18 min  Charges:    $Therapeutic Activity: 8-22 mins PT General Charges $$ ACUTE PT VISIT: 1 Visit                     [name], PT, MPT    [name] 07/16/2024, 10:36 AM

## 2024-07-16 NOTE — Progress Notes (Signed)
 [facility] Room 233 [facility] Liaison Note  Referral received from CM, Jami Brown, RN, for palliative care follow up at [facility].    Referral submitted today.  Please call with any palliative care questions or concerns.  Thank you for the opportunity to participate in this patient's care  [facility] Liaison 336 [phone]

## 2024-07-16 NOTE — Progress Notes (Signed)
 " Daily Progress Note   Patient Name: Cassandra Glover       Date: 07/16/2024 DOB: 1932/03/07  Age: 89 y.o. MRN#: [id number] Attending Physician: Arlon Carliss LELON, DO Primary Care Physician: Jyl Railing, MD Admit Date: 07/12/2024 Length of Stay: 4 days  Reason for Consultation/Follow-up: Establishing goals of care  Subjective:   CC: Establishing goals of care  Subjective: Chart reviewed including personal review of most recent notes from primary hospitalist, TOC, and neurology.    Labs reviewed and note potassium 3.3, WBC 25.5.  Participated in secure chat with Dr. Arlon and [facility] regarding discharge planning.  Initial plan was preparing for discharge, however, family feels that she needs more time to determine next steps and ensure she is medically ready for discharge.  I met today with patient's husband.  He reports that he is still struggling with next steps as he wants to ensure that they do what his wife wants to do.  Unfortunately, she has dysarthric and is not really able to communicate needs effectively even with use of visual communication devices such as board.  He reports that her appetite has been better than expected.  We talked about the options for care that have been reviewed including [facility], exploring options for other skilled facility, discharging home with home health, or discharging home with home hospice.  Philosophically, patient's husband thinks that she would want to transition home with hospice support.  At this time, however, she is not strong enough to help participate with her care needs and there is a great deal of concern about his ability to care for her at home even with the support of hospice services.  We again reviewed hospice benefits at home and discussed family's concern that even with hospice support she is not going to have round-the-clock care that she would need.  Her husband is a devoted caregiver, but he is elderly himself  and acknowledges that he is being treated for cancer and is not able to lift her or assist her to the bathroom anymore.  He then got a piece of paper with different options written on it and attempted to get patient to point to which option she wanted.  She was unable to do this and shook her head no when asked if she would make a choice.  He again stated that there is no good option and he understands the reasons for this.  Discussed safety concerns at home that were raised by his children.  He states that he is not able lift her anymore and realizes that fact.  Review of Systems Unable to assess Objective:   Vital Signs:  BP (!) 114/50 (BP Location: Right Arm)   Pulse 99   Temp 99.2 F (37.3 C) (Axillary)   Resp (!) 22   Ht 5' 3 (1.6 m)   Wt 51.6 kg   SpO2 95%   BMI 20.16 kg/m   Physical Exam: General: NAD, continues to sleep majority of encounter HENT: normocephalic, atraumatic, moist mucous membranes Cardiovascular: RRR Respiratory: no resp distress Abdomen: not distended Skin: no rashes or lesions on visible skin Neuro: Unable to assess that she slept throughout the encounter  Imaging: @IMAGES @  I personally reviewed recent imaging.   Assessment & Plan:   Assessment: 89 year old female with history of hypertension, hyperlipidemia, A-fib, CHF, hypothyroidism who presented to ED with new acute stroke. Recommendations/Plan: # Complex medical decision making/goals of care:  - DNR/DNI  - Continue conversation with patient's husband  about long-term goals and plans for discharge from the hospital.  He is still trying to have her participate in decision making, but she is not really able to do so during attempts when I am in the room.  His goals would line up with hospice care, but she is currently too frail for him to meet care needs at home.  Children feel not a safe situation and pushing for SNF due to concern for safety.  -  Code Status: Limited: Do not attempt  resuscitation (DNR) -DNR-LIMITED -Do Not Intubate/DNI   # Psychosocial Support:  - Husband, son, and daughter  # Discharge Planning: To Be Determined -Home with hospice versus skilled facility with palliative care follow-up  -  Discussed with: Patient's family, TOC, Dr. Arlon  Thank you for allowing the palliative care team to participate in the care Cassandra Glover.  Amaryllis Meissner, MD Palliative Care Provider PMT # [phone]  If patient remains symptomatic despite maximum doses, please call PMT at [phone] between 0700 and 1900. Outside of these hours, please call attending, as PMT does not have night coverage.   I personally spent a total of 60 minutes in the care of the patient today including preparing to see the patient, getting/reviewing separately obtained history, performing a medically appropriate exam/evaluation, counseling and educating, documenting clinical information in the EHR, and coordinating care.  "

## 2024-07-16 NOTE — Plan of Care (Signed)
  Problem: Elimination: Goal: Will not experience complications related to urinary retention Outcome: Progressing   Problem: Pain Managment: Goal: General experience of comfort will improve and/or be controlled Outcome: Progressing   Problem: Safety: Goal: Ability to remain free from injury will improve Outcome: Progressing   Problem: Skin Integrity: Goal: Risk for impaired skin integrity will decrease Outcome: Progressing

## 2024-07-16 NOTE — TOC Progression Note (Addendum)
 Transition of Care [facility]) - Progression Note    Patient Details  Name: Cassandra Glover MRN: [id number] Date of Birth: 12/27/1931  [facility] CM/SW Contact  Shasta DELENA Daring, RN Phone Number: 07/16/2024, 12:14 PM  Clinical Narrative:     Called patient son to advise of discharge orders. He said the family believes she is not ready for medical discharge. Advised that the medical team believes she does not need to be hospitalized anymore to get the care that she still needs, but that they are within their rights to appeal the discharge.  He said that the doctors they spoke to said she needed 3-4 more days in the hospital.  Placed him on hold to look up number to file appeal but the call was ended.  Called back and left phone number on message.  1215: Went to patient room. MD at bedside. Husband there and son is on the phone. Family wants another day to try to determine what the patient wants and what the best plan moving forward will be. Husband wants to take her home.  Son/family is in favor of her returning to SNF.  Discussed some possible safety problems they may encounter with taking her home. Advised I would return tomorrow for an update.     Expected Discharge Plan: Skilled Nursing Facility Barriers to Discharge: Barriers Resolved               Expected Discharge Plan and Services   Discharge Planning Services: CM Consult     Expected Discharge Date: 07/16/24                                     Social Drivers of Health (SDOH) Interventions SDOH Screenings   Food Insecurity: Patient Unable To Answer (07/14/2024)  Housing: Unknown (07/14/2024)  Transportation Needs: Patient Unable To Answer (07/14/2024)  Utilities: Patient Unable To Answer (07/14/2024)  Financial Resource Strain: Low Risk  (06/24/2024)   Received from [facility]  Social Connections: Patient Unable To Answer (07/14/2024)  Tobacco Use: Low Risk  (05/07/2024)    Received from [facility]    Readmission Risk Interventions     No data to display

## 2024-07-17 DIAGNOSIS — K219 Gastro-esophageal reflux disease without esophagitis: Secondary | ICD-10-CM

## 2024-07-17 DIAGNOSIS — I63412 Cerebral infarction due to embolism of left middle cerebral artery: Secondary | ICD-10-CM | POA: Diagnosis not present

## 2024-07-17 DIAGNOSIS — G20A1 Parkinson's disease without dyskinesia, without mention of fluctuations: Secondary | ICD-10-CM | POA: Diagnosis not present

## 2024-07-17 DIAGNOSIS — I482 Chronic atrial fibrillation, unspecified: Secondary | ICD-10-CM | POA: Diagnosis not present

## 2024-07-17 LAB — GLUCOSE, CAPILLARY: Glucose-Capillary: 99 mg/dL (ref 70–99)

## 2024-07-17 NOTE — TOC Transition Note (Signed)
 Transition of Care [facility]) - Discharge Note   Patient Details  Name: Cassandra Glover MRN: [id number] Date of Birth: [birth year]  Transition [facility] Care [facility]) CM/SW Contact:  Shasta DELENA Daring, RN Phone Number: 07/17/2024, 11:56 AM   Clinical Narrative:    Family has determined that they want the patient to return to [facility]. Confirmed bed with facility rep.   [facility] contacted.  No additional TOC needs.  RNCM signing off.   Final next level of care: Skilled Nursing Facility Barriers to Discharge: Barriers Resolved   Patient Goals and CMS Choice Patient states their goals for this hospitalization and ongoing recovery are:: return to [facility]          Discharge Placement              Patient [facility] bed at: [facility] [place] Patient to be transferred to facility by: [facility] Name of family member notified: Avaleigh Decuir Patient and family notified of of transfer: 07/17/24  Discharge Plan and Services Additional resources added to the After Visit Summary for     Discharge Planning Services: CM Consult                                 Social Drivers of Health (SDOH) Interventions SDOH Screenings   Food Insecurity: Patient Unable To Answer (07/14/2024)  Housing: Unknown (07/14/2024)  Transportation Needs: Patient Unable To Answer (07/14/2024)  Utilities: Patient Unable To Answer (07/14/2024)  Financial Resource Strain: Low Risk  (06/24/2024)   Received from [facility]  Social Connections: Patient Unable To Answer (07/14/2024)  Tobacco Use: Low Risk  (05/07/2024)   Received from [facility]     Readmission Risk Interventions     No data to display

## 2024-07-17 NOTE — Plan of Care (Signed)
  Problem: Elimination: Goal: Will not experience complications related to urinary retention Outcome: Progressing   Problem: Pain Managment: Goal: General experience of comfort will improve and/or be controlled Outcome: Progressing   Problem: Safety: Goal: Ability to remain free from injury will improve Outcome: Progressing   Problem: Skin Integrity: Goal: Risk for impaired skin integrity will decrease Outcome: Progressing

## 2024-07-17 NOTE — Discharge Summary (Signed)
 " Physician Discharge Summary   Patient: Cassandra Glover MRN: [id number] DOB: Dec 12, 1931  Admit date:     07/12/2024  Discharge date: 07/17/24  Discharge Physician: Carliss LELON Canales   PCP: Jyl Railing, MD   Recommendations at discharge:    Pt to be discharged to [facility] resources.   If you experience worsening fever, chills, chest pain, shortness of breath, or other concerning symptoms, please call your PCP or go to the emergency department immediately.  Discharge Diagnoses: Principal Problem:   Cerebral infarction [facility]) Active Problems:   GERD (gastroesophageal reflux disease)   Essential hypertension   Hyperlipidemia   Parkinson's disease (HCC)   Hypothyroidism   Atrial fibrillation, chronic (HCC)   Glaucoma   Osteoporosis  Resolved Problems:   * No resolved hospital problems. *   Hospital Course:  89 y.o. year old female with medical history of hypertension, hyperlipidemia, atrial fibrillation, CHF, hypothyroidism presenting to the ED with facial droop and left-sided weakness.    Assessment and Plan:   Acute CVA - Facial droop and left sided weakness on presentation.  Etiology likely from known A-fib (on Lovenox  at home).  Now aphasia as well.  Showing improvement in her weakness but aphasia persists.  MRI noting acute infarct left precentral gyrus, also chronic lacunar infarcts.  Currently on aspirin .  PT/OT/speech on board evaluations completed.  Neurology following closely.  Per neurology recommendations, continue aspirin  ending 1/24.  At that time discontinue aspirin  and transition back to Lovenox  daily thereafter.  Transition to SNF for ongoing PT/OT rehab.  Dysphagia/suspected apraxia - Evaluated by SLP.  Appears to present with severe oropharyngeal phase dysphagia.  High risk of aspiration.  Current recommendations to continue dysphagia 1 (pured) with well moistened foods, nectar liquids.  No straws!  Aspiration precautions, pills crushed.  Pt needs 1:1  feeding.  Atrial fibrillation with RVR - Initially on Cardizem  drip, subsequently weaned off to p.o. Cardizem .  Rate well-controlled.   Parkinson's disease - Continue Sinemet  and primidone .  Per neurology recommendations, patient to take his Sinemet  at 0800, 1300, and 1800.   Hypokalemia - Resolved after supplementation.   Hypothyroidism - Synthroid  on board.   Failure to thrive in adult/goals of care - Underlying Parkinson's use with acute stroke, aphasia.  Decreased p.o. intake even prior to hospitalization.  Palliative care consulted and following closely.  Husband at bedside stating they would not want to pursue NG tube.  Concerned about her overall nutritional intake.   Goal to discharge back [facility] for [facility].  Would recommend palliative care consult on DC to follow at [facility].   Consultants: Palliative care/hospice, neurology Procedures performed: None Disposition: Skilled nursing facility Diet recommendation:  Dysphagia type 1 nectar thick Liquid  DISCHARGE MEDICATION: Allergies as of 07/17/2024       Reactions   Sulfa Antibiotics Dermatitis, Hives   Hives  Hives  Hives sulfamethoxazole   Simvastatin Other (See Comments)   Joint pains   Sulfamethoxazole Rash   Hives        Medication List     STOP taking these medications    mupirocin  ointment 2 % Commonly known as: BACTROBAN        TAKE these medications    carbidopa -levodopa  25-100 MG tablet Commonly known as: SINEMET  IR Take 2 tablets by mouth 3 (three) times daily. Take 2 tablets at 0800, 1300, and 1800 daily What changed: additional instructions The timing of this medication is very important.   pramipexole  0.5 MG tablet Commonly  known as: MIRAPEX  Take 0.5 mg by mouth at bedtime. The timing of this medication is very important.   acetaminophen  500 MG tablet Commonly known as: TYLENOL  Take 1,000 mg by mouth every 8 (eight) hours as needed.   aspirin  EC 81 MG  tablet Take 1 tablet (81 mg total) by mouth daily for 1 day. Swallow whole.   atorvastatin 20 MG tablet Commonly known as: LIPITOR Take by mouth.   brimonidine  0.2 % ophthalmic solution Commonly known as: ALPHAGAN  Place 1 drop into the right eye 2 (two) times daily.   Calcium-Vitamin D 500-125 MG-UNIT Tabs Take 1 tablet by mouth 2 (two) times daily.   cetirizine  10 MG tablet Commonly known as: ZYRTEC  Take 1 tablet (10 mg total) by mouth daily as needed for allergies or rhinitis. What changed:  when to take this reasons to take this   Cholecalciferol 50 MCG (2000 UT) Caps Take 1 capsule by mouth daily.   diltiazem  180 MG 24 hr capsule Commonly known as: CARDIZEM  CD Take 180 mg by mouth at bedtime.   dorzolamide -timolol  2-0.5 % ophthalmic solution Commonly known as: COSOPT  Place 1 drop into both eyes 2 (two) times daily.   enoxaparin  60 MG/0.6ML injection Commonly known as: LOVENOX  Inject 0.5 mLs (50 mg total) into the skin daily. Start taking on: July 18, 2024   guaiFENesin  600 MG 12 hr tablet Commonly known as: MUCINEX  Take 1 tablet (600 mg total) by mouth 2 (two) times daily as needed for up to 14 days for cough or to loosen phlegm. What changed:  when to take this reasons to take this   latanoprost  0.005 % ophthalmic solution Commonly known as: XALATAN  Place 1 drop into both eyes at bedtime.   levothyroxine  25 MCG tablet Commonly known as: SYNTHROID  Take 25 mcg by mouth daily. On Tuesday and Thursday   levothyroxine  25 MCG tablet Commonly known as: SYNTHROID  Take 2 tablets by mouth daily. On Sun, Mon, Wed, Fri, and Sat   prednisoLONE  acetate 1 % ophthalmic suspension Commonly known as: PRED FORTE  Place 1 drop into both eyes daily.   PreserVision AREDS 2 Caps Take 1 capsule by mouth 2 (two) times daily with a meal.   primidone  50 MG tablet Commonly known as: MYSOLINE  Take 50 mg by mouth 2 (two) times daily.   senna-docusate 8.6-50 MG  tablet Commonly known as: Senokot-S Take 2 tablets by mouth 2 (two) times daily as needed.               Discharge Care Instructions  (From admission, onward)           Start     Ordered   07/17/24 0000  Discharge wound care:       Comments: Cleanse with Vashe Soila [phone]), pat dry Apply xeroform and foam. Change daily   07/17/24 1148   07/16/24 0000  Discharge wound care:       Comments: Cleanse with Vashe, pat dry Apply xeroform and foam. Change daily   07/16/24 1044            Contact information for after-discharge care     Destination     [facility]   Service: Skilled Nursing Contact information: [address] [place]  [address] [phone]                     Discharge Exam: Filed Weights   07/15/24 0500 07/16/24 0500 07/17/24 0500  Weight: 51.3 kg 51.6 kg  54.7 kg    GENERAL:  Alert, pleasant, no acute distress, cachectic HEENT:  EOMI CARDIOVASCULAR: Irregularly irregular  RESPIRATORY:  Clear to auscultation, no wheezing, rales, or rhonchi GASTROINTESTINAL:  Soft, nontender, nondistended EXTREMITIES: Frail, no LE edema bilaterally NEURO: Aphasia/dysarthria, moving extremities SKIN:  No rashes noted, thin PSYCH:  Appropriate mood and affect    Condition at discharge: improving  The results of significant diagnostics from this hospitalization (including imaging, microbiology, ancillary and laboratory) are listed below for reference.   Imaging Studies: EEG adult Result Date: 07/14/2024 Shelton Arlin KIDD, MD     07/16/2024 10:00 AM Patient Name: KARON COTTERILL MRN: [id number] Epilepsy Attending: Arlin KIDD Shelton Referring Physician/Provider: Matthews Elida HERO, MD Date: 07/13/2024 Duration: 21.02 mins Patient history:  89 y.o. female with hx of HTN, HL, a fib on therapeutic lovenox , CHF hypothyroidism presenting to ED with L facial droop and LUE and LLE weakness. EEG to evaluate for seizure. Level  of alertness: Awake AEDs during EEG study: None Technical aspects: This EEG study was done with scalp electrodes positioned according to the 10-20 International system of electrode placement. Electrical activity was reviewed with band pass filter of 1-70Hz , sensitivity of 7 uV/mm, display speed of 57mm/sec with a 60Hz  notched filter applied as appropriate. EEG data were recorded continuously and digitally stored.  Video monitoring was available and reviewed as appropriate. Description: The posterior dominant rhythm consists of 8 Hz activity of moderate voltage (25-35 uV) seen predominantly in posterior head regions, symmetric and reactive to eye opening and eye closing. EEG showed intermittent generalized 3 to 6 Hz theta-delta slowing. Physiologic photic driving was seen during photic stimulation.  Hyperventilation was not performed.   ABNORMALITY - Intermittent slow, generalized IMPRESSION: This study is suggestive of mild diffuse encephalopathy. No seizures or epileptiform discharges were seen throughout the recording. Arlin KIDD Shelton   ECHOCARDIOGRAM COMPLETE BUBBLE STUDY Result Date: 07/13/2024    ECHOCARDIOGRAM REPORT   Patient Name:   SHIANN KAM Date of Exam: 07/13/2024 Medical Rec #:  [id number]          Height:       63.0 in Accession #:    [id number]         Weight:       109.1 lb Date of Birth:  Aug 20, 1931          BSA:          1.495 m Patient Age:    89 years           BP:           121/73 mmHg Patient Gender: F                  HR:           91 bpm. Exam Location:  Inpatient Procedure: 2D Echo (Both Spectral and Color Flow Doppler were utilized during            procedure). Indications:     stroke  History:         Patient has no prior history of Echocardiogram examinations.                  Parkinsons, Arrythmias:Atrial Fibrillation; Risk                  Factors:Hypertension and Dyslipidemia.  Sonographer:     Tinnie Barefoot RDCS Referring Phys:  [id number] ELIDA HERO STACK Diagnosing Phys:  Deatrice Cage MD IMPRESSIONS  1. Left ventricular ejection  fraction, by estimation, is 50 to 55%. The left ventricle has low normal function. The left ventricle has no regional wall motion abnormalities. Left ventricular diastolic parameters are indeterminate.  2. Right ventricular systolic function is mildly reduced. The right ventricular size is mildly enlarged. Moderately increased right ventricular wall thickness. There is moderately elevated pulmonary artery systolic pressure. The estimated right ventricular systolic pressure is 55.6 mmHg.  3. Left atrial size was mildly dilated.  4. Right atrial size was severely dilated.  5. The mitral valve is normal in structure. Mild mitral valve regurgitation. No evidence of mitral stenosis.  6. Tricuspid valve regurgitation is moderate to severe.  7. The aortic valve is normal in structure. Aortic valve regurgitation is mild. Aortic valve sclerosis/calcification is present, without any evidence of aortic stenosis.  8. Moderately dilated pulmonary artery.  9. The inferior vena cava is dilated in size with >50% respiratory variability, suggesting right atrial pressure of 8 mmHg. 10. Agitated saline contrast bubble study was negative, with no evidence of any interatrial shunt. FINDINGS  Left Ventricle: Left ventricular ejection fraction, by estimation, is 50 to 55%. The left ventricle has low normal function. The left ventricle has no regional wall motion abnormalities. The left ventricular internal cavity size was normal in size. There is no left ventricular hypertrophy. Left ventricular diastolic parameters are indeterminate. Right Ventricle: The right ventricular size is mildly enlarged. Moderately increased right ventricular wall thickness. Right ventricular systolic function is mildly reduced. There is moderately elevated pulmonary artery systolic pressure. The tricuspid regurgitant velocity is 3.45 m/s, and with an assumed right atrial pressure of 8 mmHg, the  estimated right ventricular systolic pressure is 55.6 mmHg. Left Atrium: Left atrial size was mildly dilated. Right Atrium: Right atrial size was severely dilated. Pericardium: There is no evidence of pericardial effusion. Mitral Valve: The mitral valve is normal in structure. Mild mitral valve regurgitation. No evidence of mitral valve stenosis. Tricuspid Valve: The tricuspid valve is normal in structure. Tricuspid valve regurgitation is moderate to severe. No evidence of tricuspid stenosis. Aortic Valve: The aortic valve is normal in structure. Aortic valve regurgitation is mild. Aortic regurgitation PHT measures 446 msec. Aortic valve sclerosis/calcification is present, without any evidence of aortic stenosis. Pulmonic Valve: The pulmonic valve was normal in structure. Pulmonic valve regurgitation is trivial. No evidence of pulmonic stenosis. Aorta: The aortic root is normal in size and structure. Pulmonary Artery: The pulmonary artery is moderately dilated. Venous: The inferior vena cava is dilated in size with greater than 50% respiratory variability, suggesting right atrial pressure of 8 mmHg. IAS/Shunts: No atrial level shunt detected by color flow Doppler. Agitated saline contrast was given intravenously to evaluate for intracardiac shunting. Agitated saline contrast bubble study was negative, with no evidence of any interatrial shunt.  LEFT VENTRICLE PLAX 2D LVIDd:         3.50 cm LVIDs:         2.60 cm LV PW:         1.00 cm LV IVS:        0.90 cm LVOT diam:     1.90 cm LV SV:         30 LV SV Index:   20 LVOT Area:     2.84 cm LV IVRT:       60 msec  LV Volumes (MOD) LV vol d, MOD A2C: 40.6 ml LV vol d, MOD A4C: 42.8 ml LV vol s, MOD A2C: 20.2 ml LV vol s, MOD A4C: 21.3  ml LV SV MOD A2C:     20.4 ml LV SV MOD A4C:     42.8 ml LV SV MOD BP:      22.9 ml RIGHT VENTRICLE             IVC RV Basal diam:  2.80 cm     IVC diam: 1.90 cm RV S prime:     12.40 cm/s TAPSE (M-mode): 1.4 cm LEFT ATRIUM              Index        RIGHT ATRIUM           Index LA diam:        3.70 cm 2.48 cm/m   RA Area:     26.30 cm LA Vol (A2C):   68.0 ml 45.50 ml/m  RA Volume:   79.60 ml  53.26 ml/m LA Vol (A4C):   61.5 ml 41.15 ml/m LA Biplane Vol: 69.2 ml 46.30 ml/m  AORTIC VALVE LVOT Vmax:   66.67 cm/s LVOT Vmean:  44.700 cm/s LVOT VTI:    0.107 m AI PHT:      446 msec  AORTA Ao Root diam: 3.00 cm Ao Asc diam:  3.00 cm TRICUSPID VALVE TR Peak grad:   47.6 mmHg TR Vmax:        345.00 cm/s  SHUNTS Systemic VTI:  0.11 m Systemic Diam: 1.90 cm Deatrice Cage MD Electronically signed by Deatrice Cage MD Signature Date/Time: 07/13/2024/10:49:54 AM    Final    DG Abd 1 View Result Date: 07/12/2024 EXAM: 1 VIEW XRAY OF THE ABDOMEN 07/12/2024 03:30:37 PM COMPARISON: None available. CLINICAL HISTORY: Abdominal pain, acute. FINDINGS: BOWEL: Nonobstructive bowel gas pattern. Gas in left hemicolon. Moderate stool in distal colon. SOFT TISSUES: Vascular calcifications. BONES: No acute fracture. Thoracolumbar fusion hardware. Partially imaged right hip arthroplasty. CHEST BASE: Cardiomegaly. IMPRESSION: 1. Nonobstructive bowel gas pattern with moderate distal colonic stool burden. 2. Cardiomegaly and vascular calcifications. Electronically signed by: Greig Pique MD 07/12/2024 04:14 PM EST RP Workstation: [id number]   MR BRAIN WO CONTRAST Result Date: 07/12/2024 EXAM: MRI BRAIN WITHOUT CONTRAST 07/12/2024 11:53:20 AM TECHNIQUE: Multiplanar multisequence MRI of the head/brain was performed without the administration of intravenous contrast. COMPARISON: CT angiogram of the head and neck dated 07/12/2024. CLINICAL HISTORY: Neuro deficit, acute, stroke suspected. FINDINGS: BRAIN AND VENTRICLES: Acute non hemorrhagic infarction in the left precentral gyrus. There is age-related atrophy and mild-to-moderate cerebral white matter disease. There are chronic lacunar infarcts again demonstrated within the internal capsules bilaterally. No other acute  intracranial hemorrhage. No mass. No midline shift. No hydrocephalus. The sella is unremarkable. Normal flow voids. ORBITS: The patient is status post bilateral lens replacement. SINUSES AND MASTOIDS: There is moderate mucosal disease within the paranasal sinuses. BONES AND SOFT TISSUES: Normal marrow signal. No soft tissue abnormality. IMPRESSION: 1. Restricted diffusion within the cortex of the left precentral gyrus compatible with acute non hemorrhagic infarction. 2. Age-related atrophy and mild-to-moderate cerebral white matter disease. 3. Chronic lacunar infarcts within the internal capsules bilaterally. 4. Moderate paranasal sinus mucosal disease. Electronically signed by: Evalene Coho MD 07/12/2024 12:03 PM EST RP Workstation: [id number]   CT ANGIO HEAD NECK W WO CM Result Date: 07/12/2024 EXAM: CTA HEAD AND NECK  WITH 07/12/2024 09:12:00 AM TECHNIQUE: CTA of the head and neck was performed with the administration of 75 mL of iohexol  (OMNIPAQUE ) 350 MG/ML injection. Multiplanar 2D and/or 3D reformatted images are provided for review. Automated exposure control, iterative reconstruction,  and/or weight based adjustment of the mA/kV was utilized to reduce the radiation dose to as low as reasonably achievable. Stenosis of the internal carotid arteries measured using NASCET criteria. COMPARISON: None available CLINICAL HISTORY: Neuro deficit, acute, stroke suspected; R facial droop L sided extremity weakness. FINDINGS: CTA NECK: AORTIC ARCH AND ARCH VESSELS: No dissection or arterial injury. No significant stenosis of the brachiocephalic or subclavian arteries. CERVICAL CAROTID ARTERIES: The common carotid and internal carotid arteries are tortuous. The right common carotid artery has a retropharyngeal bend, which displaces the esophagus to the left. There is mild calcific atheromatous disease within the carotid bulbs, but no flow-limiting stenosis. No dissection or arterial injury. CERVICAL VERTEBRAL  ARTERIES: The vertebral arteries are codominant. No dissection, arterial injury, or significant stenosis. LUNGS AND MEDIASTINUM: Unremarkable. SOFT TISSUES: No acute abnormality. BONES: No acute abnormality. CTA HEAD: ANTERIOR CIRCULATION: There is mild calcific plaque within the carotid siphons with no flow-limiting stenosis. No significant stenosis of the anterior cerebral arteries. No significant stenosis of the middle cerebral arteries. No aneurysm. POSTERIOR CIRCULATION: No significant stenosis of the posterior cerebral arteries. No significant stenosis of the basilar artery. No significant stenosis of the vertebral arteries. No aneurysm. OTHER: There is paranasal sinus disease present. No dural venous sinus thrombosis on this non-dedicated study. The above findings were communicated to Doctor Matthews at 9:19 AM on 07/12/2024. IMPRESSION: 1. No large vessel occlusion, hemodynamically significant stenosis, or aneurysm in the head or neck. 2. Mild calcific atheromatous disease within the carotid bulbs and carotid siphons without flow-limiting stenosis. 3. Tortuous common carotid and internal carotid arteries, with the right common carotid artery having a retropharyngeal bend displacing the esophagus to the left. 4. Findings communicated to Dr. Matthews at 9:19 AM on 07/12/2024. Electronically signed by: Evalene Coho MD 07/12/2024 09:23 AM EST RP Workstation: [id number]   CT HEAD CODE STROKE WO CONTRAST Result Date: 07/12/2024 EXAM: CT HEAD WITHOUT CONTRAST 07/12/2024 08:55:00 AM TECHNIQUE: CT of the head was performed without the administration of intravenous contrast. Automated exposure control, iterative reconstruction, and/or weight based adjustment of the mA/kV was utilized to reduce the radiation dose to as low as reasonably achievable. COMPARISON: None available. CLINICAL HISTORY: Neuro deficit, acute, stroke suspected. FINDINGS: BRAIN AND VENTRICLES: No acute hemorrhage. No evidence of acute infarct. No  hydrocephalus. No extra-axial collection. No mass effect or midline shift. There is age-related volume loss. There are chronic lacunar infarcts within the internal capsules bilaterally. ORBITS: No acute abnormality. The patient is status post bilateral lens replacement. SINUSES: There is an air fluid level within the right maxillary sinus and the left sphenoid sinus. There is moderate opacification of the ethmoid and sphenoid sinuses and the floor of the right frontal sinus. SOFT TISSUES AND SKULL: No acute soft tissue abnormality. No skull fracture. Findings were communicated to Doctor Matthews at 08:58 AM 07/12/2024. [place] Stroke Program Early CT Score (ASPECTS) ----- Ganglionic (caudate, IC, lentiform nucleus, insula, M1-M3): 7 Supraganglionic (M4-M6): 3 Total: 10 IMPRESSION: 1. No acute intracranial abnormality to suggest acute infarct or hemorrhage in the setting of suspected acute stroke. 2. Age-related volume loss and chronic lacunar infarcts within the internal capsules bilaterally. 3. Moderate paranasal sinus disease with air-fluid levels in the right maxillary and left sphenoid sinuses, which can be seen with acute sinusitis. 4. These findings were communicated to Dr. Matthews at 08:58 AM on 07/12/2024. ASPECTS: 10. Electronically signed by: Evalene Coho MD 07/12/2024 09:00 AM EST RP Workstation: [id number]    Microbiology: No results found  for this or any previous visit.  Labs: CBC: Recent Labs  Lab 07/12/24 0848 07/13/24 0436 07/16/24 0326  WBC 10.7* 11.9* 25.5*  NEUTROABS 6.9  --   --   HGB 12.1 11.5* 10.4*  HCT 36.9 35.7* 31.8*  MCV 92.5 93.7 92.7  PLT 465* 412* 349   Basic Metabolic Panel: Recent Labs  Lab 07/12/24 0848 07/12/24 1052 07/13/24 0436 07/16/24 0326  NA 135  --  134* 139  K 3.2*  --  4.1 3.3*  CL 99  --  101 107  CO2 25  --  20* 22  GLUCOSE 104*  --  109* 115*  BUN 12  --  14 27*  CREATININE 0.61  --  0.86 0.94  CALCIUM 9.2  --  8.6* 8.8*  MG  --  1.9  --   2.0   Liver Function Tests: Recent Labs  Lab 07/12/24 0848  AST 24  ALT 15  ALKPHOS 92  BILITOT 1.2  PROT 7.8  ALBUMIN 3.4*   CBG: Recent Labs  Lab 07/13/24 1323 07/14/24 0753 07/15/24 0809 07/16/24 0735 07/17/24 0834  GLUCAP 173* 99 129* 112* 99    Discharge time spent: 35 minutes.  Length of inpatient stay: 5 days  Signed: Carliss LELON Canales, DO [facility] 07/17/2024         "

## 2024-07-26 DEATH — deceased
